# Patient Record
Sex: Female | Born: 1980 | Hispanic: Yes | Marital: Married | Smoking: Never smoker
Health system: Southern US, Community
[De-identification: ages and names within clinical notes are randomized; demographics above are authoritative.]

## PROBLEM LIST (undated history)

## (undated) DIAGNOSIS — I10 Essential (primary) hypertension: Secondary | ICD-10-CM

## (undated) HISTORY — PX: TUBAL LIGATION: SHX77

## (undated) HISTORY — PX: KNEE SURGERY: SHX244

## (undated) HISTORY — DX: Essential (primary) hypertension: I10

---

## 2001-02-14 ENCOUNTER — Encounter: Payer: Self-pay | Admitting: Emergency Medicine

## 2001-02-14 ENCOUNTER — Encounter (INDEPENDENT_AMBULATORY_CARE_PROVIDER_SITE_OTHER): Payer: Self-pay | Admitting: Specialist

## 2001-02-14 ENCOUNTER — Ambulatory Visit (HOSPITAL_COMMUNITY): Admission: AD | Admit: 2001-02-14 | Discharge: 2001-02-14 | Payer: Self-pay | Admitting: Obstetrics & Gynecology

## 2001-12-22 ENCOUNTER — Other Ambulatory Visit: Admission: RE | Admit: 2001-12-22 | Discharge: 2001-12-22 | Payer: Self-pay | Admitting: Gynecology

## 2002-07-19 ENCOUNTER — Inpatient Hospital Stay (HOSPITAL_COMMUNITY): Admission: AD | Admit: 2002-07-19 | Discharge: 2002-07-22 | Payer: Self-pay | Admitting: Gynecology

## 2002-09-07 ENCOUNTER — Other Ambulatory Visit: Admission: RE | Admit: 2002-09-07 | Discharge: 2002-09-07 | Payer: Self-pay | Admitting: Gynecology

## 2003-09-13 ENCOUNTER — Emergency Department (HOSPITAL_COMMUNITY): Admission: EM | Admit: 2003-09-13 | Discharge: 2003-09-13 | Payer: Self-pay | Admitting: Emergency Medicine

## 2005-01-29 ENCOUNTER — Emergency Department (HOSPITAL_COMMUNITY): Admission: EM | Admit: 2005-01-29 | Discharge: 2005-01-29 | Payer: Self-pay | Admitting: Family Medicine

## 2006-12-24 ENCOUNTER — Emergency Department (HOSPITAL_COMMUNITY): Admission: EM | Admit: 2006-12-24 | Discharge: 2006-12-24 | Payer: Self-pay | Admitting: Emergency Medicine

## 2007-01-02 ENCOUNTER — Emergency Department (HOSPITAL_COMMUNITY): Admission: EM | Admit: 2007-01-02 | Discharge: 2007-01-02 | Payer: Self-pay | Admitting: Emergency Medicine

## 2007-01-29 ENCOUNTER — Encounter: Admission: RE | Admit: 2007-01-29 | Discharge: 2007-01-29 | Payer: Self-pay | Admitting: *Deleted

## 2007-02-17 ENCOUNTER — Encounter: Admission: RE | Admit: 2007-02-17 | Discharge: 2007-03-11 | Payer: Self-pay | Admitting: *Deleted

## 2007-08-08 IMAGING — CR DG RIBS W/ CHEST 3+V*R*
3 series · 3 of 3 positions shown · non-contrast
Comparison: 01/02/07.

CLINICAL DATA: 26 year old with rib fractures. 
 PA CHEST WITH RIGHT RIBS THREE VIEWS:

[view not recorded (1 of 3)]
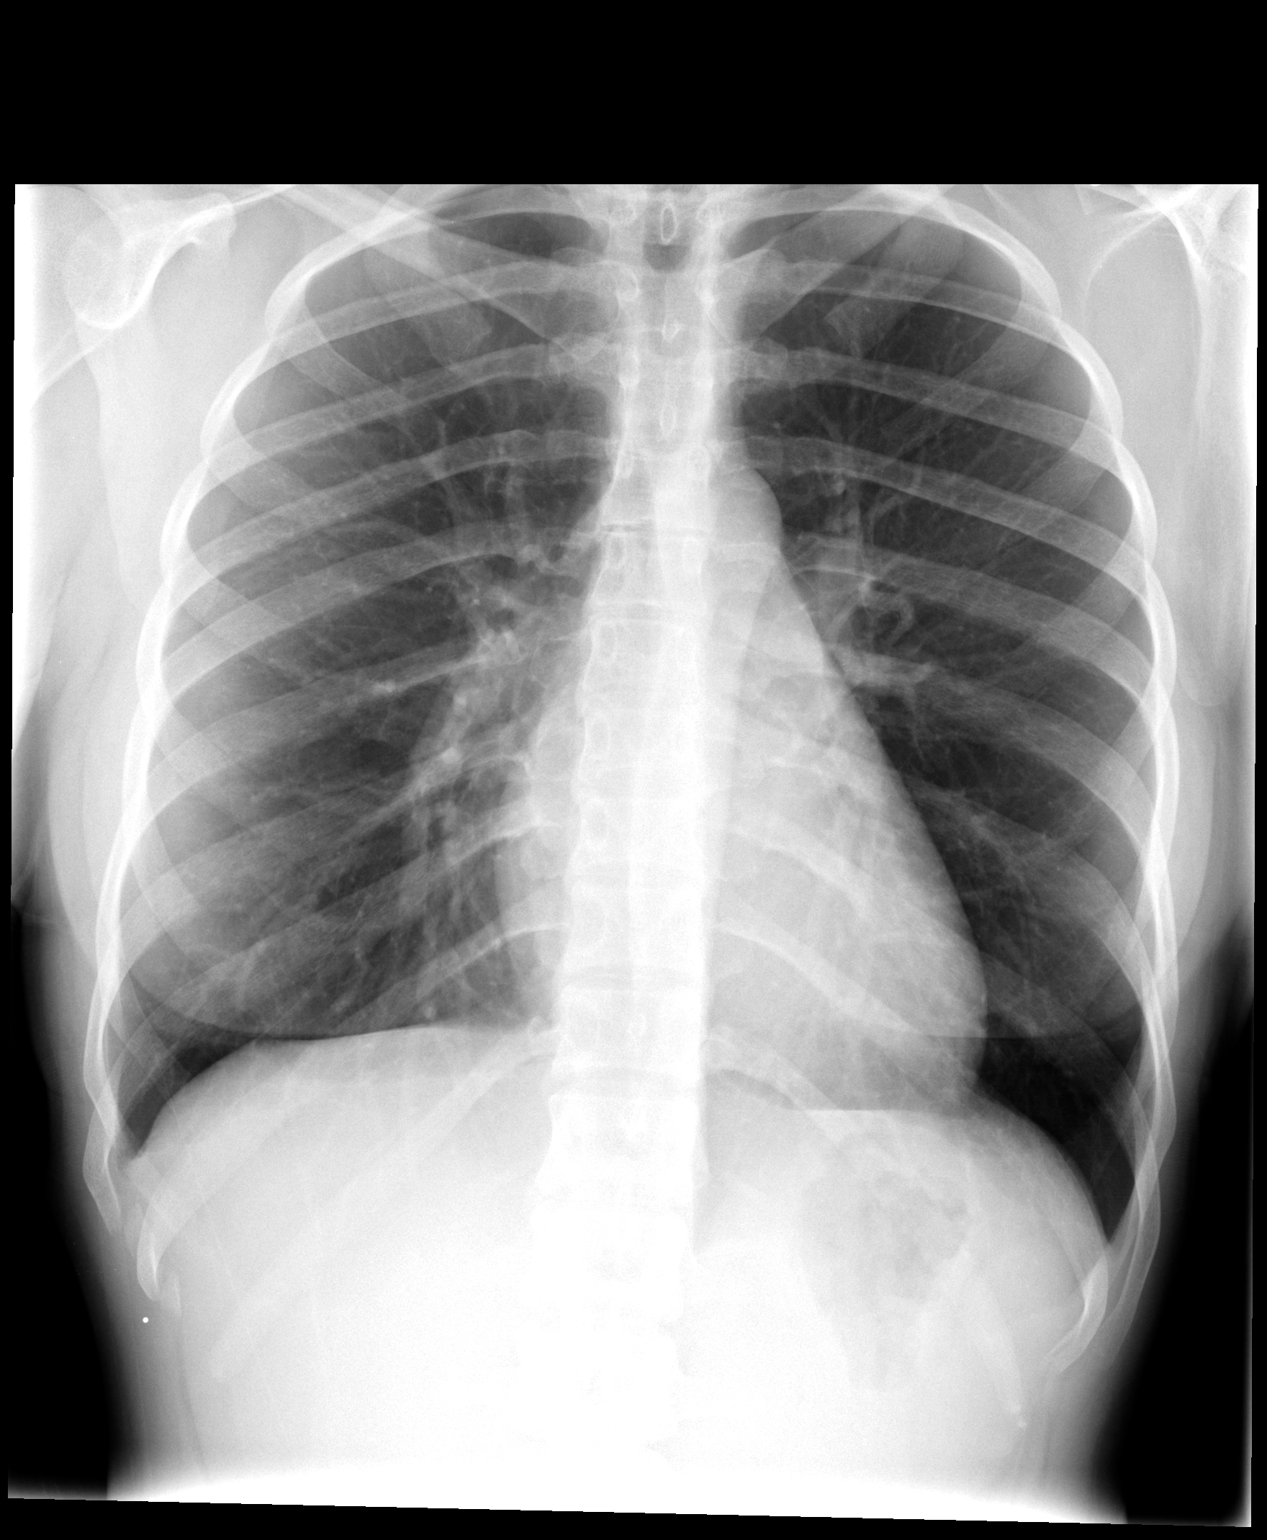

[view not recorded (2 of 3)]
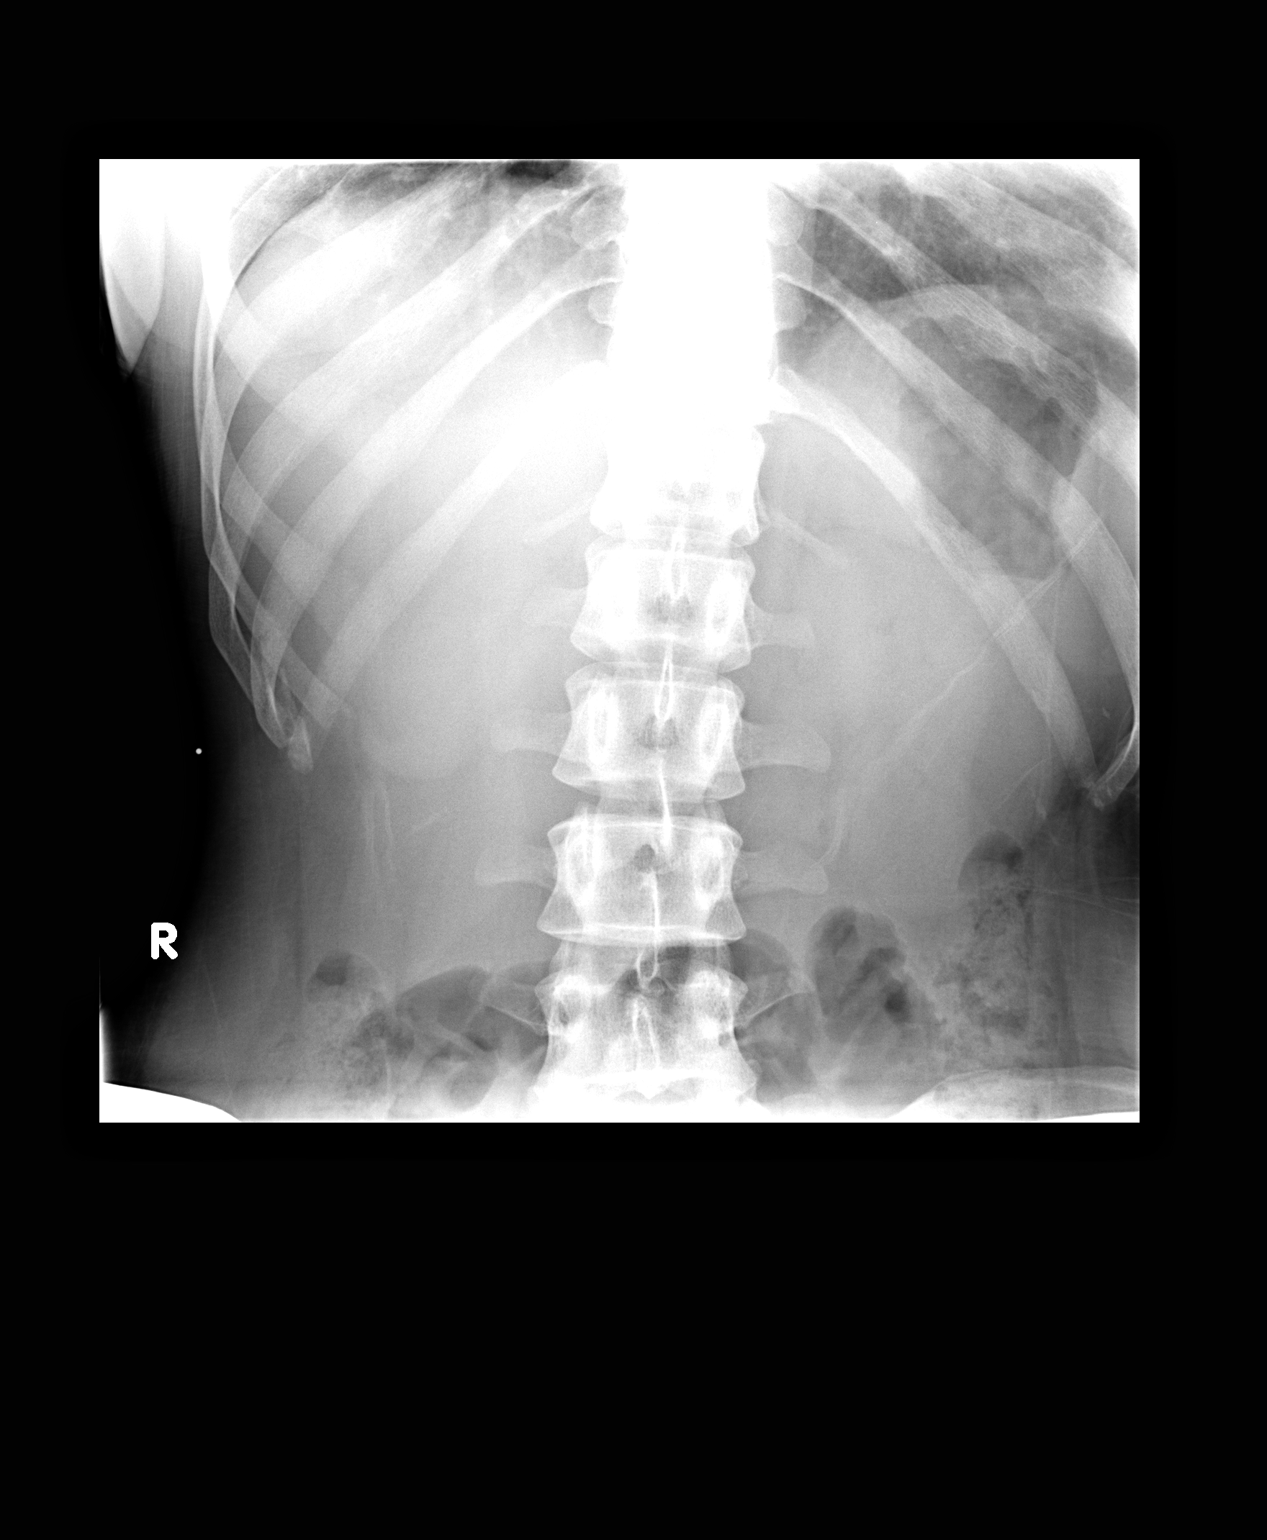

[view not recorded (3 of 3)]
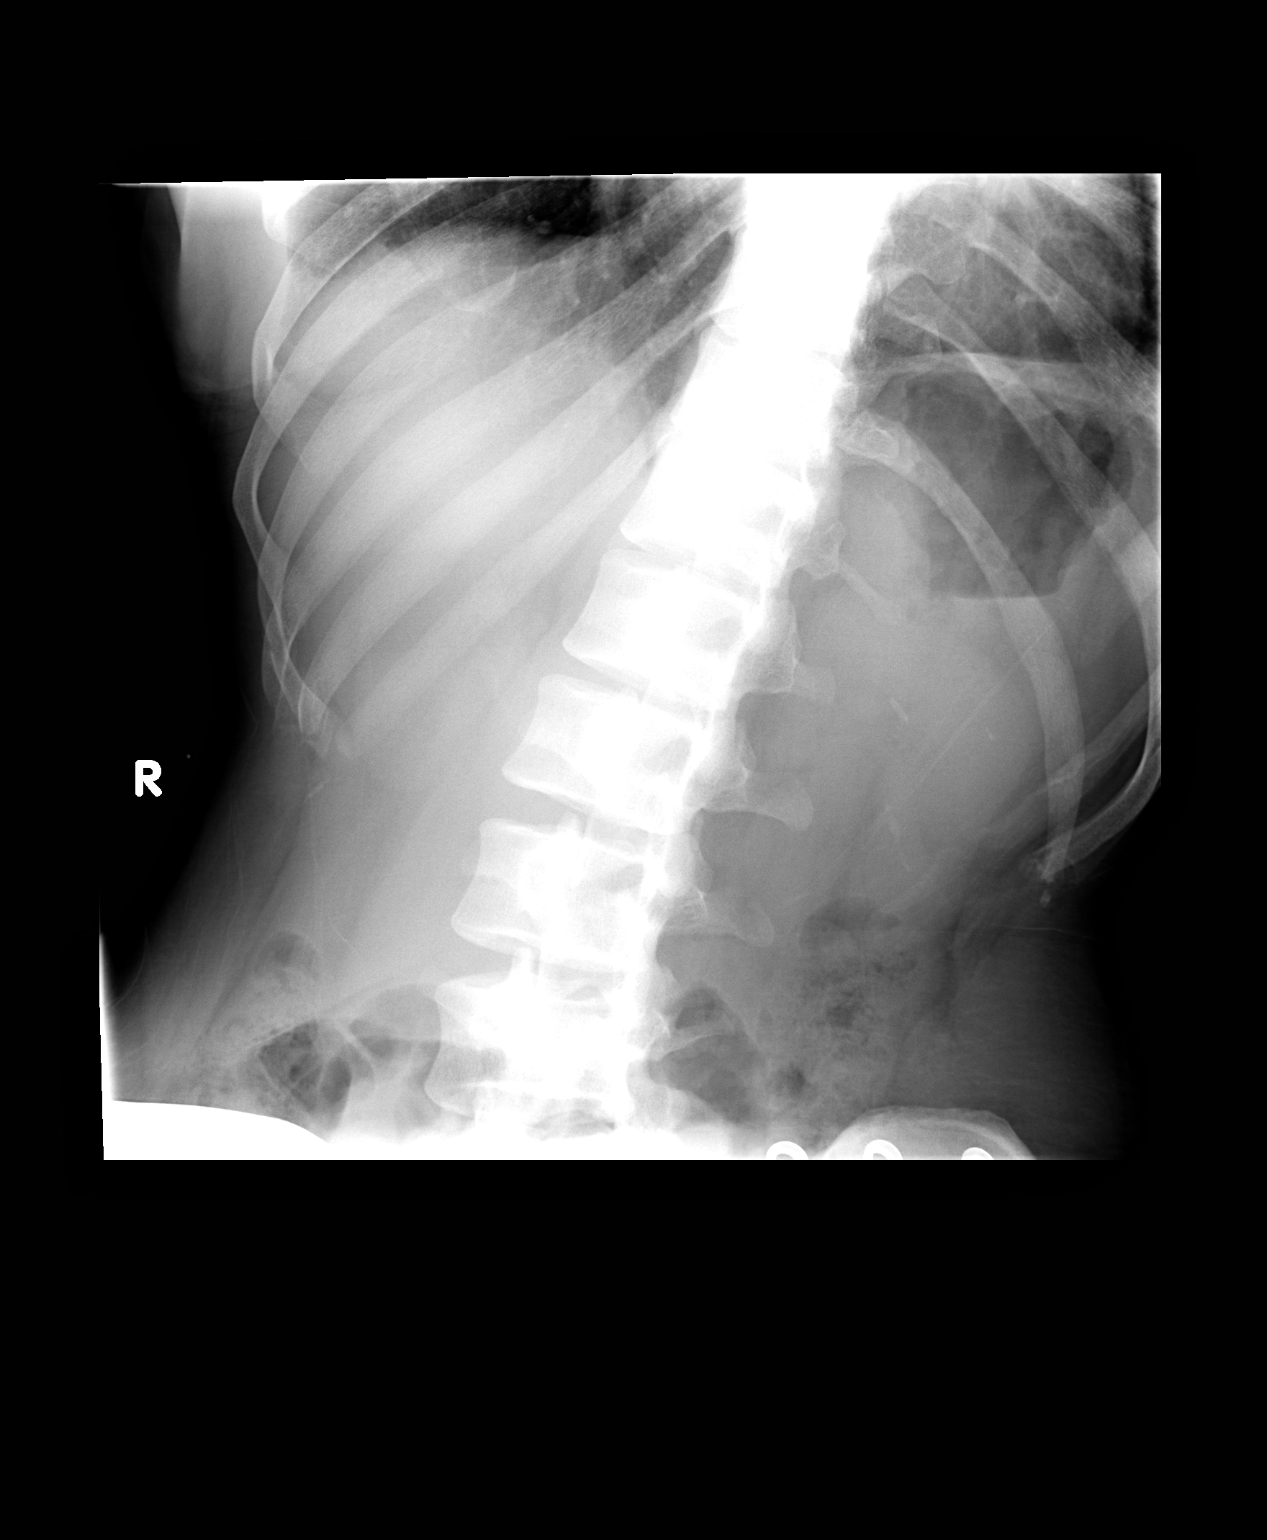

[3 of 3 positions shown; findings below may reference images not displayed]

FINDINGS: There are ununited fractures of the distal tips of the 10th and 11th ribs.  There is interval change with smooth margins.  No new fractures are seen.  The cardiac silhouette, mediastinal, and hilar contours are stable.  Lungs are clear.
IMPRESSION: 1.  Ununited 10th and 11th rib fracture on the right side.

## 2007-08-18 ENCOUNTER — Encounter: Admission: RE | Admit: 2007-08-18 | Discharge: 2007-08-18 | Payer: Self-pay | Admitting: Family Medicine

## 2009-01-13 ENCOUNTER — Emergency Department (HOSPITAL_COMMUNITY): Admission: EM | Admit: 2009-01-13 | Discharge: 2009-01-13 | Payer: Self-pay | Admitting: Family Medicine

## 2009-05-28 ENCOUNTER — Emergency Department (HOSPITAL_COMMUNITY): Admission: EM | Admit: 2009-05-28 | Discharge: 2009-05-28 | Payer: Self-pay | Admitting: Family Medicine

## 2009-05-29 ENCOUNTER — Inpatient Hospital Stay (HOSPITAL_COMMUNITY): Admission: AD | Admit: 2009-05-29 | Discharge: 2009-05-29 | Payer: Self-pay | Admitting: Gynecology

## 2009-06-01 ENCOUNTER — Inpatient Hospital Stay (HOSPITAL_COMMUNITY): Admission: AD | Admit: 2009-06-01 | Discharge: 2009-06-01 | Payer: Self-pay | Admitting: Gynecology

## 2009-06-08 ENCOUNTER — Ambulatory Visit (HOSPITAL_COMMUNITY): Admission: RE | Admit: 2009-06-08 | Discharge: 2009-06-08 | Payer: Self-pay | Admitting: Family Medicine

## 2009-06-26 ENCOUNTER — Encounter (INDEPENDENT_AMBULATORY_CARE_PROVIDER_SITE_OTHER): Payer: Self-pay | Admitting: Obstetrics and Gynecology

## 2009-06-26 ENCOUNTER — Ambulatory Visit (HOSPITAL_COMMUNITY): Admission: RE | Admit: 2009-06-26 | Discharge: 2009-06-26 | Payer: Self-pay | Admitting: Obstetrics and Gynecology

## 2010-05-31 ENCOUNTER — Inpatient Hospital Stay (HOSPITAL_COMMUNITY): Admission: RE | Admit: 2010-05-31 | Discharge: 2010-06-02 | Payer: Self-pay | Admitting: Obstetrics and Gynecology

## 2010-05-31 ENCOUNTER — Encounter (INDEPENDENT_AMBULATORY_CARE_PROVIDER_SITE_OTHER): Payer: Self-pay | Admitting: Obstetrics and Gynecology

## 2010-06-18 ENCOUNTER — Emergency Department (HOSPITAL_COMMUNITY): Admission: EM | Admit: 2010-06-18 | Discharge: 2010-06-18 | Payer: Self-pay | Admitting: Emergency Medicine

## 2010-11-22 LAB — CBC
HCT: 33.5 % — ABNORMAL LOW (ref 36.0–46.0)
HCT: 33.8 % — ABNORMAL LOW (ref 36.0–46.0)
Hemoglobin: 11.3 g/dL — ABNORMAL LOW (ref 12.0–15.0)
Hemoglobin: 11.5 g/dL — ABNORMAL LOW (ref 12.0–15.0)
MCH: 30.7 pg (ref 26.0–34.0)
RBC: 3.73 MIL/uL — ABNORMAL LOW (ref 3.87–5.11)
WBC: 11.9 10*3/uL — ABNORMAL HIGH (ref 4.0–10.5)

## 2010-11-22 LAB — RPR: RPR Ser Ql: NONREACTIVE

## 2010-11-22 LAB — SURGICAL PCR SCREEN
MRSA, PCR: NEGATIVE
Staphylococcus aureus: NEGATIVE

## 2010-12-13 LAB — APTT: aPTT: 36 seconds (ref 24–37)

## 2010-12-13 LAB — TYPE AND SCREEN: Antibody Screen: NEGATIVE

## 2010-12-13 LAB — CBC
Hemoglobin: 12.4 g/dL (ref 12.0–15.0)
RBC: 4.11 MIL/uL (ref 3.87–5.11)

## 2010-12-13 LAB — PROTIME-INR: INR: 1.09 (ref 0.00–1.49)

## 2010-12-14 LAB — HCG, QUANTITATIVE, PREGNANCY: hCG, Beta Chain, Quant, S: 262 m[IU]/mL — ABNORMAL HIGH (ref <5)

## 2010-12-14 LAB — POCT URINALYSIS DIP (DEVICE)
Bilirubin Urine: NEGATIVE
Glucose, UA: NEGATIVE mg/dL
Ketones, ur: NEGATIVE mg/dL
Protein, ur: NEGATIVE mg/dL

## 2010-12-14 LAB — GC/CHLAMYDIA PROBE AMP, GENITAL: Chlamydia, DNA Probe: NEGATIVE

## 2010-12-14 LAB — WET PREP, GENITAL: Yeast Wet Prep HPF POC: NONE SEEN

## 2010-12-14 LAB — POCT PREGNANCY, URINE: Preg Test, Ur: POSITIVE

## 2011-01-25 NOTE — Op Note (Signed)
[facility]  Patient:    Courtney [name], Courtney [name]                     MRN: [id number] Proc. Date: 02/14/01 Adm. Date:  [id number] Attending:  Lars Pinks                           Operative Report  PREOPERATIVE DIAGNOSIS:       Missed abortion.  POSTOPERATIVE DIAGNOSIS:      Missed abortion.  PROCEDURE:                    Dilatation and evacuation.  SURGEON:                      Richard D. Arlyce Dice, M.D.  ANESTHESIA:                   Paracervical block with IV sedation.  ESTIMATED BLOOD LOSS:         10 cc.  FINDINGS:                     Products of conception consistent with early missed abortion.  INDICATIONS:                  This is a 30 year old primigravida who presented to [facility] with vaginal bleeding and cramping and was found to be pregnant with a quantitative hCG of over 4000. An ultrasound was performed which showed a gestational sac diameter consistent with 6-week pregnancy but no signs of embryonic development or yolk sac. Based upon this ultrasound report and the presenting symptoms of the patient, the diagnosis of missed abortion was made. The options were discussed with the patient who elected to proceed with dilatation and evacuation. The patient was therefore transferred to [facility] for the operation. On arrival to [facility], informed consent was given. She was then taken to the operating room for the procedure.  DESCRIPTION OF PROCEDURE:     The patient was taken to the operating room, placed in supine position, and IV sedation was administered. She was then placed in the dorsal lithotomy position and the vagina and vulva were prepped and draped in sterile fashion. The cervix was visualized and grasped with a single-tooth tenaculum, and 20 cc of 1% lidocaine was infiltrated in the paracervical tissues. A #7 suction curet was then introduced into the endometrial cavity without the need of  dilatation. Suction curettage was then carried out until the endometrial cavity was empty. The procedure was then terminated and the patient left the operating room in good condition. DD:  02/14/01 TD:  02/15/01 Job: [id number]

## 2011-01-25 NOTE — Discharge Summary (Signed)
   NAME:  Courtney [name], Courtney [name]                        ACCOUNT NO.:  [id number]   MEDICAL RECORD NO.:  [id number]                   PATIENT TYPE:  INP   LOCATION:  9141                                 FACILITY:  [facility]   PHYSICIAN:  Timothy P. Fontaine, M.D.           DATE OF BIRTH:  [birth year]   DATE OF ADMISSION:  07/19/2002  DATE OF DISCHARGE:  07/22/2002                                 DISCHARGE SUMMARY   DISCHARGE DIAGNOSES:  1. Intrauterine pregnancy 39 weeks, delivered.  2. Footling breech presentation.  3. Status post primary low transverse cesarean section by Dr. Colin Broach July 19, 2002.   HISTORY:  This is [name] 30 year old female gravida 2, para 0, aborta 1 with an  EDC of July 25, 2002 whose prenatal course was complicated by having  persistent footling breech and patient did not desire attempt at version and  opted for primary cesarean section.  Therefore, was admitted for same.   HOSPITAL COURSE:  On July 19, 2002 patient was admitted and underwent [name]  primary low transverse cesarean section by Dr. Colin Broach without  complications with delivery of [name] female, Apgars of 9 and 9, weight 7 pounds  5 ounces.  There were no complications.  Postpartum patient remained  afebrile, voiding, stable condition and she was discharged to home on  July 22, 2002 and given [facility] Gynecology postpartum  instruction/postpartum booklet.   ACCESSORY CLINICAL FINDINGS:  Laboratories:  The patient is O+.  Rubella  immune.  On July 20, 2002 hemoglobin was 11.   DISPOSITION:  The patient is discharged home.  Informed to return to office  in six weeks.  Any problem prior to that time to see in the office.  Given [name]  prescription for Tylox p.r.n. pain.     Susa Loffler, P.[name].                    Timothy P. Audie Box, M.D.    TSG/MEDQ  D:  08/20/2002  T:  08/20/2002  Job:  [id number]

## 2011-01-25 NOTE — H&P (Signed)
   NAME:  Courtney Courtney [name], Courtney Courtney [name]                        ACCOUNT NO.:  [id number]   MEDICAL RECORD NO.:  [id number]                   PATIENT TYPE:  INP   LOCATION:  NA                                   FACILITY:  [facility]   PHYSICIAN:  Timothy P. Fontaine, M.D.           DATE OF BIRTH:  [birth year]   DATE OF ADMISSION:  07/19/2002  DATE OF DISCHARGE:                                HISTORY & PHYSICAL   CHIEF COMPLAINT:  1. Pregnancy at term.  2. Breech presentation.   HISTORY OF PRESENT ILLNESS:  Courtney [name] 30 year old G2, P0, AB1 female at term  gestation with Courtney [name] persistent breech presentation.  Options for version versus  primary section was discussed with the patient and her husband and they  desire primary section.  Prenatal course has been uncomplicated to date.  See the remainder of the [place] for the rest of her history.   PHYSICAL EXAMINATION:  HEENT:  Normal.  LUNGS:  Clear.  CARDIAC:  Regular rate.  No murmur, rub, or gallop.  ABDOMEN:  Breech consistent with term.  Positive fetal heart tones.  PELVIC:  Deferred.   ASSESSMENT:  Courtney [name] 30 year old G2, P0, AB1 female term gestation, breech  presentation for primary cesarean section.  Risks, benefits, indications,  and alternatives for the procedure were reviewed with the patient and her  husband to include infection, prolonged antibiotics, abscess formation,  drainage, wound complications requiring opening and draining of wounds,  closure be secondary intention, hemorrhage necessitating transfusion, and  the risk of transfusion, inadvertent injury to internal organs including  bowel, bladder, ureters, vessels, and nerves necessitating major exploratory  reparative surgeries and future reparative surgeries including ostomy  formation.  The risks of fetal injury during the birthing process was also  discussed, understood, and accepted.  The patient's questions are answered  and she is ready to proceed with surgery.                     Timothy P. Audie Box, M.D.    TPF/MEDQ  D:  07/14/2002  T:  07/14/2002  Job:  [id number]

## 2011-01-25 NOTE — Op Note (Signed)
NAME:  Courtney [name], Courtney [name]                        ACCOUNT NO.:  [id number]   MEDICAL RECORD NO.:  [id number]                   PATIENT TYPE:  INP   LOCATION:  9198                                 FACILITY:  [facility]   PHYSICIAN:  Timothy P. Fontaine, M.D.           DATE OF BIRTH:  12-May-1981   DATE OF PROCEDURE:  07/19/2002  DATE OF DISCHARGE:                                 OPERATIVE REPORT   PREOPERATIVE DIAGNOSES:  1. Pregnancy at term.  2. Footling breech presentation.   POSTOPERATIVE DIAGNOSES:  1. Pregnancy at term.  2. Footling breech presentation.   PROCEDURE:  Primary low transverse cervical cesarean section.   SURGEON:  Timothy P. Fontaine, M.D.   ASSISTANTGaetano Hawthorne. Lily Peer, M.D.   ANESTHESIA:  Spinal.   ESTIMATED BLOOD LOSS:  Less than 500 cc.   COMPLICATIONS:  None.   SPECIMENS:  Samples of cord blood.   FINDINGS:  At 9 normal female in the footling breech presentation.  Apgars 9 and 9.  Weight pending.  Pelvic anatomy noted to be normal.   PROCEDURE:  The patient is taken to the operating room.  Underwent regional  anesthesia.  Was placed in left tilt supine position.  Received an abdominal  preparation with Betadine scrub and Betadine solution.  Foley catheter  placed in [name] sterile technique and the patient was draped in the usual  fashion.  After assuring adequate anesthesia the abdomen was sharply entered  through [name] Pfannenstiel incision achieving adequate hemostasis at all levels.  The bladder flap was sharply and bluntly developed without difficulty and  the uterus was sharply entered in the lower uterine segment and bluntly  extended laterally.  The membranes were ruptured, the fluid noted to be  clear.  The infant found in the footling breech presentation and underwent [name]  classic breech extraction without difficulty.  The nares and mouth were  suctioned, the cord doubly clamped and cut, and the infant was handed to  pediatrics in attendance.  Samples  of cord blood were obtained.  Placenta  was then spontaneously extruded, noted to be intact.  The uterus was  exteriorized.  The endometrial cavity explored with [name] sponge to remove all  placental membrane fragments.  The patient received 1 g Ancef of IV  prophylaxis at this time.  The uterine incision was closed in one layer  using 0 Vicryl suture in [name] running interlocking stitch.  Adequate hemostasis  was visualized after copious irrigation and the uterus was returned to the  abdomen with copious irrigation.  The anterior fascia was then  reapproximated using 0 Vicryl suture in [name] running stitch starting at the  angle, meeting in the midline.  Subcutaneous tissue was irrigated.  Hemostasis achieved with electrocautery.  Skin reapproximated with staples.  Sterile dressing applied.  The patient was taken to recovery room in good  condition having tolerated the procedure.  Timothy P. Audie Box, M.D.    TPF/MEDQ  D:  07/19/2002  T:  07/19/2002  Job:  [id number]

## 2011-02-26 ENCOUNTER — Inpatient Hospital Stay (INDEPENDENT_AMBULATORY_CARE_PROVIDER_SITE_OTHER)
Admission: RE | Admit: 2011-02-26 | Discharge: 2011-02-26 | Disposition: A | Discharge status: Home / Self Care | Payer: BC Managed Care – PPO | Source: Ambulatory Visit | Attending: Emergency Medicine | Admitting: Emergency Medicine

## 2011-02-26 ENCOUNTER — Ambulatory Visit (INDEPENDENT_AMBULATORY_CARE_PROVIDER_SITE_OTHER): Payer: BC Managed Care – PPO

## 2011-02-26 DIAGNOSIS — S61209A Unspecified open wound of unspecified finger without damage to nail, initial encounter: Secondary | ICD-10-CM

## 2011-02-28 ENCOUNTER — Inpatient Hospital Stay (INDEPENDENT_AMBULATORY_CARE_PROVIDER_SITE_OTHER)
Admission: RE | Admit: 2011-02-28 | Discharge: 2011-02-28 | Disposition: A | Discharge status: Home / Self Care | Payer: BC Managed Care – PPO | Source: Ambulatory Visit | Attending: Family Medicine | Admitting: Family Medicine

## 2011-02-28 DIAGNOSIS — S61209A Unspecified open wound of unspecified finger without damage to nail, initial encounter: Secondary | ICD-10-CM

## 2012-06-09 LAB — HM PAP SMEAR

## 2013-04-16 ENCOUNTER — Encounter: Payer: Self-pay | Admitting: Internal Medicine

## 2013-04-16 ENCOUNTER — Other Ambulatory Visit (INDEPENDENT_AMBULATORY_CARE_PROVIDER_SITE_OTHER): Payer: BC Managed Care – PPO

## 2013-04-16 ENCOUNTER — Ambulatory Visit (INDEPENDENT_AMBULATORY_CARE_PROVIDER_SITE_OTHER): Payer: BC Managed Care – PPO | Admitting: Internal Medicine

## 2013-04-16 VITALS — BP 116/78 | HR 68 | Temp 98.6°F | Resp 16 | Ht 66.0 in | Wt 176.0 lb

## 2013-04-16 DIAGNOSIS — D649 Anemia, unspecified: Secondary | ICD-10-CM

## 2013-04-16 DIAGNOSIS — Z23 Encounter for immunization: Secondary | ICD-10-CM

## 2013-04-16 DIAGNOSIS — Z0000 Encounter for general adult medical examination without abnormal findings: Secondary | ICD-10-CM

## 2013-04-16 LAB — COMPREHENSIVE METABOLIC PANEL
ALT: 19 U/L (ref 0–35)
Alkaline Phosphatase: 30 U/L — ABNORMAL LOW (ref 39–117)
Creatinine, Ser: 0.9 mg/dL (ref 0.4–1.2)
GFR: 81.21 mL/min (ref >60.00)
Sodium: 140 mEq/L (ref 135–145)
Total Bilirubin: 0.4 mg/dL (ref 0.3–1.2)
Total Protein: 6.9 g/dL (ref 6.0–8.3)

## 2013-04-16 LAB — LIPID PANEL
Cholesterol: 180 mg/dL (ref 0–200)
HDL: 98.2 mg/dL (ref >39.00)
Total CHOL/HDL Ratio: 2
Triglycerides: 71 mg/dL (ref 0.0–149.0)

## 2013-04-16 LAB — CBC WITH DIFFERENTIAL/PLATELET
Basophils Absolute: 0 10*3/uL (ref 0.0–0.1)
Eosinophils Absolute: 0.2 10*3/uL (ref 0.0–0.7)
HCT: 37.3 % (ref 36.0–46.0)
Hemoglobin: 12.4 g/dL (ref 12.0–15.0)
Lymphs Abs: 2.5 10*3/uL (ref 0.7–4.0)
MCHC: 33.1 g/dL (ref 30.0–36.0)
MCV: 89.2 fl (ref 78.0–100.0)
Monocytes Absolute: 0.5 10*3/uL (ref 0.1–1.0)
Neutro Abs: 5.8 10*3/uL (ref 1.4–7.7)
Platelets: 208 10*3/uL (ref 150.0–400.0)
RDW: 13.2 % (ref 11.5–14.6)

## 2013-04-16 LAB — TSH: TSH: 0.78 u[IU]/mL (ref 0.35–5.50)

## 2013-04-16 LAB — FOLATE: Folate: 23.2 ng/mL (ref >5.9)

## 2013-04-16 LAB — IBC PANEL
Iron: 78 ug/dL (ref 42–145)
Transferrin: 315.1 mg/dL (ref 212.0–360.0)

## 2013-04-16 NOTE — Assessment & Plan Note (Signed)
I think she has iron deficiency from heavy periods, I will recheck her CBC and will look at her vitamin levels as well

## 2013-04-16 NOTE — Progress Notes (Signed)
  Subjective:    Patient ID: Courtney [name], female    DOB: 1980/10/24, 32 y.o.   MRN: [id number]  Anemia Presents for follow-up visit. There has been no abdominal pain, anorexia, bruising/bleeding easily, confusion, fever, leg swelling, light-headedness, malaise/fatigue, pallor, palpitations, paresthesias, pica or weight loss. Signs of blood loss that are present include menorrhagia. Signs of blood loss that are not present include hematemesis, hematochezia, melena and vaginal bleeding. There are no compliance problems.       Review of Systems  Constitutional: Negative.  Negative for fever, chills, weight loss, malaise/fatigue, diaphoresis, appetite change and fatigue.  HENT: Negative.   Eyes: Negative.   Respiratory: Negative.  Negative for cough, chest tightness, shortness of breath, wheezing and stridor.   Cardiovascular: Negative.  Negative for chest pain, palpitations and leg swelling.  Gastrointestinal: Negative.  Negative for nausea, abdominal pain, diarrhea, constipation, melena, hematochezia, anal bleeding, anorexia and hematemesis.  Endocrine: Negative.   Genitourinary: Positive for menorrhagia. Negative for hematuria, flank pain, vaginal bleeding, vaginal discharge and vaginal pain.  Musculoskeletal: Negative.   Skin: Negative.  Negative for pallor.  Allergic/Immunologic: Negative.   Neurological: Negative.  Negative for dizziness, tremors, weakness, light-headedness, numbness, headaches and paresthesias.  Hematological: Negative.  Negative for adenopathy. Does not bruise/bleed easily.  Psychiatric/Behavioral: Negative.  Negative for confusion.       Objective:   Physical Exam  Vitals reviewed. Constitutional: She is oriented to person, place, and time. She appears well-developed and well-nourished. No distress.  HENT:  Head: Normocephalic and atraumatic.  Mouth/Throat: Oropharynx is clear and moist. No oropharyngeal exudate.  Eyes: Conjunctivae are normal. Right eye  exhibits no discharge. Left eye exhibits no discharge. No scleral icterus.  Neck: Normal range of motion. Neck supple. No JVD present. No tracheal deviation present. No thyromegaly present.  Cardiovascular: Normal rate, regular rhythm, normal heart sounds and intact distal pulses.  Exam reveals no gallop and no friction rub.   No murmur heard. Pulmonary/Chest: Effort normal and breath sounds normal. No stridor. No respiratory distress. She has no wheezes. She has no rales. She exhibits no tenderness.  Abdominal: Soft. Bowel sounds are normal. She exhibits no distension and no mass. There is no tenderness. There is no rebound and no guarding.  Musculoskeletal: Normal range of motion. She exhibits no edema and no tenderness.  Lymphadenopathy:    She has no cervical adenopathy.  Neurological: She is oriented to person, place, and time.  Skin: Skin is warm and dry. No rash noted. She is not diaphoretic. No erythema. No pallor.  Psychiatric: She has a normal mood and affect. Her behavior is normal. Judgment and thought content normal.     Lab Results  Component Value Date   WBC 11.9* 06/01/2010   HGB 11.3* 06/01/2010   HCT 33.5* 06/01/2010   PLT 197 06/01/2010   INR 1.09 06/26/2009       Assessment & Plan:

## 2013-04-16 NOTE — Patient Instructions (Signed)
Preventive Care for Adults, Female A healthy lifestyle and preventive care can promote health and wellness. Preventive health guidelines for women include the following key practices.  A routine yearly physical is a good way to check with your caregiver about your health and preventive screening. It is a chance to share any concerns and updates on your health, and to receive a thorough exam.  Visit your dentist for a routine exam and preventive care every 6 months. Brush your teeth twice a day and floss once a day. Good oral hygiene prevents tooth decay and gum disease.  The frequency of eye exams is based on your age, health, family medical history, use of contact lenses, and other factors. Follow your caregiver's recommendations for frequency of eye exams.  Eat a healthy diet. Foods like vegetables, fruits, whole grains, low-fat dairy products, and lean protein foods contain the nutrients you need without too many calories. Decrease your intake of foods high in solid fats, added sugars, and salt. Eat the right amount of calories for you.Get information about a proper diet from your caregiver, if necessary.  Regular physical exercise is one of the most important things you can do for your health. Most adults should get at least 150 minutes of moderate-intensity exercise (any activity that increases your heart rate and causes you to sweat) each week. In addition, most adults need muscle-strengthening exercises on 2 or more days a week.  Maintain a healthy weight. The body mass index (BMI) is a screening tool to identify possible weight problems. It provides an estimate of body fat based on height and weight. Your caregiver can help determine your BMI, and can help you achieve or maintain a healthy weight.For adults 20 years and older:  A BMI below 18.5 is considered underweight.  A BMI of 18.5 to 24.9 is normal.  A BMI of 25 to 29.9 is considered overweight.  A BMI of 30 and above is  considered obese.  Maintain normal blood lipids and cholesterol levels by exercising and minimizing your intake of saturated fat. Eat a balanced diet with plenty of fruit and vegetables. Blood tests for lipids and cholesterol should begin at age 20 and be repeated every 5 years. If your lipid or cholesterol levels are high, you are over 50, or you are at high risk for heart disease, you may need your cholesterol levels checked more frequently.Ongoing high lipid and cholesterol levels should be treated with medicines if diet and exercise are not effective.  If you smoke, find out from your caregiver how to quit. If you do not use tobacco, do not start.  If you are pregnant, do not drink alcohol. If you are breastfeeding, be very cautious about drinking alcohol. If you are not pregnant and choose to drink alcohol, do not exceed 1 drink per day. One drink is considered to be 12 ounces (355 mL) of beer, 5 ounces (148 mL) of wine, or 1.5 ounces (44 mL) of liquor.  Avoid use of street drugs. Do not share needles with anyone. Ask for help if you need support or instructions about stopping the use of drugs.  High blood pressure causes heart disease and increases the risk of stroke. Your blood pressure should be checked at least every 1 to 2 years. Ongoing high blood pressure should be treated with medicines if weight loss and exercise are not effective.  If you are 55 to 34 years old, ask your caregiver if you should take aspirin to prevent strokes.  Diabetes   screening involves taking a blood sample to check your fasting blood sugar level. This should be done once every 3 years, after age 45, if you are within normal weight and without risk factors for diabetes. Testing should be considered at a younger age or be carried out more frequently if you are overweight and have at least 1 risk factor for diabetes.  Breast cancer screening is essential preventive care for women. You should practice "breast  self-awareness." This means understanding the normal appearance and feel of your breasts and may include breast self-examination. Any changes detected, no matter how small, should be reported to a caregiver. Women in their 20s and 30s should have a clinical breast exam (CBE) by a caregiver as part of a regular health exam every 1 to 3 years. After age 40, women should have a CBE every year. Starting at age 40, women should consider having a mammography (breast X-ray test) every year. Women who have a family history of breast cancer should talk to their caregiver about genetic screening. Women at a high risk of breast cancer should talk to their caregivers about having magnetic resonance imaging (MRI) and a mammography every year.  The Pap test is a screening test for cervical cancer. A Pap test can show cell changes on the cervix that might become cervical cancer if left untreated. A Pap test is a procedure in which cells are obtained and examined from the lower end of the uterus (cervix).  Women should have a Pap test starting at age 21.  Between ages 21 and 29, Pap tests should be repeated every 2 years.  Beginning at age 30, you should have a Pap test every 3 years as long as the past 3 Pap tests have been normal.  Some women have medical problems that increase the chance of getting cervical cancer. Talk to your caregiver about these problems. It is especially important to talk to your caregiver if a new problem develops soon after your last Pap test. In these cases, your caregiver may recommend more frequent screening and Pap tests.  The above recommendations are the same for women who have or have not gotten the vaccine for human papillomavirus (HPV).  If you had a hysterectomy for a problem that was not cancer or a condition that could lead to cancer, then you no longer need Pap tests. Even if you no longer need a Pap test, a regular exam is a good idea to make sure no other problems are  starting.  If you are between ages 65 and 70, and you have had normal Pap tests going back 10 years, you no longer need Pap tests. Even if you no longer need a Pap test, a regular exam is a good idea to make sure no other problems are starting.  If you have had past treatment for cervical cancer or a condition that could lead to cancer, you need Pap tests and screening for cancer for at least 20 years after your treatment.  If Pap tests have been discontinued, risk factors (such as a new sexual partner) need to be reassessed to determine if screening should be resumed.  The HPV test is an additional test that may be used for cervical cancer screening. The HPV test looks for the virus that can cause the cell changes on the cervix. The cells collected during the Pap test can be tested for HPV. The HPV test could be used to screen women aged 30 years and older, and should   be used in women of any age who have unclear Pap test results. After the age of 30, women should have HPV testing at the same frequency as a Pap test.  Colorectal cancer can be detected and often prevented. Most routine colorectal cancer screening begins at the age of 50 and continues through age 75. However, your caregiver may recommend screening at an earlier age if you have risk factors for colon cancer. On a yearly basis, your caregiver may provide home test kits to check for hidden blood in the stool. Use of a small camera at the end of a tube, to directly examine the colon (sigmoidoscopy or colonoscopy), can detect the earliest forms of colorectal cancer. Talk to your caregiver about this at age 50, when routine screening begins. Direct examination of the colon should be repeated every 5 to 10 years through age 75, unless early forms of pre-cancerous polyps or small growths are found.  Hepatitis C blood testing is recommended for all people born from 1945 through 1965 and any individual with known risks for hepatitis C.  Practice  safe sex. Use condoms and avoid high-risk sexual practices to reduce the spread of sexually transmitted infections (STIs). STIs include gonorrhea, chlamydia, syphilis, trichomonas, herpes, HPV, and human immunodeficiency virus (HIV). Herpes, HIV, and HPV are viral illnesses that have no cure. They can result in disability, cancer, and death. Sexually active women aged 25 and younger should be checked for chlamydia. Older women with new or multiple partners should also be tested for chlamydia. Testing for other STIs is recommended if you are sexually active and at increased risk.  Osteoporosis is a disease in which the bones lose minerals and strength with aging. This can result in serious bone fractures. The risk of osteoporosis can be identified using a bone density scan. Women ages 65 and over and women at risk for fractures or osteoporosis should discuss screening with their caregivers. Ask your caregiver whether you should take a calcium supplement or vitamin D to reduce the rate of osteoporosis.  Menopause can be associated with physical symptoms and risks. Hormone replacement therapy is available to decrease symptoms and risks. You should talk to your caregiver about whether hormone replacement therapy is right for you.  Use sunscreen with sun protection factor (SPF) of 30 or more. Apply sunscreen liberally and repeatedly throughout the day. You should seek shade when your shadow is shorter than you. Protect yourself by wearing long sleeves, pants, a wide-brimmed hat, and sunglasses year round, whenever you are outdoors.  Once a month, do a whole body skin exam, using a mirror to look at the skin on your back. Notify your caregiver of new moles, moles that have irregular borders, moles that are larger than a pencil eraser, or moles that have changed in shape or color.  Stay current with required immunizations.  Influenza. You need a dose every fall (or winter). The composition of the flu vaccine  changes each year, so being vaccinated once is not enough.  Pneumococcal polysaccharide. You need 1 to 2 doses if you smoke cigarettes or if you have certain chronic medical conditions. You need 1 dose at age 65 (or older) if you have never been vaccinated.  Tetanus, diphtheria, pertussis (Tdap, Td). Get 1 dose of Tdap vaccine if you are younger than age 65, are over 65 and have contact with an infant, are a healthcare worker, are pregnant, or simply want to be protected from whooping cough. After that, you need a Td   booster dose every 10 years. Consult your caregiver if you have not had at least 3 tetanus and diphtheria-containing shots sometime in your life or have a deep or dirty wound.  HPV. You need this vaccine if you are a woman age 26 or younger. The vaccine is given in 3 doses over 6 months.  Measles, mumps, rubella (MMR). You need at least 1 dose of MMR if you were born in 1957 or later. You may also need a second dose.  Meningococcal. If you are age 19 to 21 and a first-year college student living in a residence hall, or have one of several medical conditions, you need to get vaccinated against meningococcal disease. You may also need additional booster doses.  Zoster (shingles). If you are age 60 or older, you should get this vaccine.  Varicella (chickenpox). If you have never had chickenpox or you were vaccinated but received only 1 dose, talk to your caregiver to find out if you need this vaccine.  Hepatitis A. You need this vaccine if you have a specific risk factor for hepatitis A virus infection or you simply wish to be protected from this disease. The vaccine is usually given as 2 doses, 6 to 18 months apart.  Hepatitis B. You need this vaccine if you have a specific risk factor for hepatitis B virus infection or you simply wish to be protected from this disease. The vaccine is given in 3 doses, usually over 6 months. Preventive Services / Frequency Ages 19 to 39  Blood  pressure check.** / Every 1 to 2 years.  Lipid and cholesterol check.** / Every 5 years beginning at age 20.  Clinical breast exam.** / Every 3 years for women in their 20s and 30s.  Pap test.** / Every 2 years from ages 21 through 29. Every 3 years starting at age 30 through age 65 or 70 with a history of 3 consecutive normal Pap tests.  HPV screening.** / Every 3 years from ages 30 through ages 65 to 70 with a history of 3 consecutive normal Pap tests.  Hepatitis C blood test.** / For any individual with known risks for hepatitis C.  Skin self-exam. / Monthly.  Influenza immunization.** / Every year.  Pneumococcal polysaccharide immunization.** / 1 to 2 doses if you smoke cigarettes or if you have certain chronic medical conditions.  Tetanus, diphtheria, pertussis (Tdap, Td) immunization. / A one-time dose of Tdap vaccine. After that, you need a Td booster dose every 10 years.  HPV immunization. / 3 doses over 6 months, if you are 26 and younger.  Measles, mumps, rubella (MMR) immunization. / You need at least 1 dose of MMR if you were born in 1957 or later. You may also need a second dose.  Meningococcal immunization. / 1 dose if you are age 19 to 21 and a first-year college student living in a residence hall, or have one of several medical conditions, you need to get vaccinated against meningococcal disease. You may also need additional booster doses.  Varicella immunization.** / Consult your caregiver.  Hepatitis A immunization.** / Consult your caregiver. 2 doses, 6 to 18 months apart.  Hepatitis B immunization.** / Consult your caregiver. 3 doses usually over 6 months. Ages 40 to 64  Blood pressure check.** / Every 1 to 2 years.  Lipid and cholesterol check.** / Every 5 years beginning at age 20.  Clinical breast exam.** / Every year after age 40.  Mammogram.** / Every year beginning at age 40   and continuing for as long as you are in good health. Consult with your  caregiver.  Pap test.** / Every 3 years starting at age 30 through age 65 or 70 with a history of 3 consecutive normal Pap tests.  HPV screening.** / Every 3 years from ages 30 through ages 65 to 70 with a history of 3 consecutive normal Pap tests.  Fecal occult blood test (FOBT) of stool. / Every year beginning at age 50 and continuing until age 75. You may not need to do this test if you get a colonoscopy every 10 years.  Flexible sigmoidoscopy or colonoscopy.** / Every 5 years for a flexible sigmoidoscopy or every 10 years for a colonoscopy beginning at age 50 and continuing until age 75.  Hepatitis C blood test.** / For all people born from 1945 through 1965 and any individual with known risks for hepatitis C.  Skin self-exam. / Monthly.  Influenza immunization.** / Every year.  Pneumococcal polysaccharide immunization.** / 1 to 2 doses if you smoke cigarettes or if you have certain chronic medical conditions.  Tetanus, diphtheria, pertussis (Tdap, Td) immunization.** / A one-time dose of Tdap vaccine. After that, you need a Td booster dose every 10 years.  Measles, mumps, rubella (MMR) immunization. / You need at least 1 dose of MMR if you were born in 1957 or later. You may also need a second dose.  Varicella immunization.** / Consult your caregiver.  Meningococcal immunization.** / Consult your caregiver.  Hepatitis A immunization.** / Consult your caregiver. 2 doses, 6 to 18 months apart.  Hepatitis B immunization.** / Consult your caregiver. 3 doses, usually over 6 months. Ages 65 and over  Blood pressure check.** / Every 1 to 2 years.  Lipid and cholesterol check.** / Every 5 years beginning at age 20.  Clinical breast exam.** / Every year after age 40.  Mammogram.** / Every year beginning at age 40 and continuing for as long as you are in good health. Consult with your caregiver.  Pap test.** / Every 3 years starting at age 30 through age 65 or 70 with a 3  consecutive normal Pap tests. Testing can be stopped between 65 and 70 with 3 consecutive normal Pap tests and no abnormal Pap or HPV tests in the past 10 years.  HPV screening.** / Every 3 years from ages 30 through ages 65 or 70 with a history of 3 consecutive normal Pap tests. Testing can be stopped between 65 and 70 with 3 consecutive normal Pap tests and no abnormal Pap or HPV tests in the past 10 years.  Fecal occult blood test (FOBT) of stool. / Every year beginning at age 50 and continuing until age 75. You may not need to do this test if you get a colonoscopy every 10 years.  Flexible sigmoidoscopy or colonoscopy.** / Every 5 years for a flexible sigmoidoscopy or every 10 years for a colonoscopy beginning at age 50 and continuing until age 75.  Hepatitis C blood test.** / For all people born from 1945 through 1965 and any individual with known risks for hepatitis C.  Osteoporosis screening.** / A one-time screening for women ages 65 and over and women at risk for fractures or osteoporosis.  Skin self-exam. / Monthly.  Influenza immunization.** / Every year.  Pneumococcal polysaccharide immunization.** / 1 dose at age 65 (or older) if you have never been vaccinated.  Tetanus, diphtheria, pertussis (Tdap, Td) immunization. / A one-time dose of Tdap vaccine if you are over   65 and have contact with an infant, are a healthcare worker, or simply want to be protected from whooping cough. After that, you need a Td booster dose every 10 years.  Varicella immunization.** / Consult your caregiver.  Meningococcal immunization.** / Consult your caregiver.  Hepatitis A immunization.** / Consult your caregiver. 2 doses, 6 to 18 months apart.  Hepatitis B immunization.** / Check with your caregiver. 3 doses, usually over 6 months. ** Family history and personal history of risk and conditions may change your caregiver's recommendations. Document Released: 10/22/2001 Document Revised: 11/18/2011  Document Reviewed: 01/21/2011 [employer] Patient Information 2014 [employer].  

## 2013-04-16 NOTE — Assessment & Plan Note (Signed)
Exam done Tdap was updated Labs ordered Pt ed material was given 

## 2013-04-18 ENCOUNTER — Encounter: Payer: Self-pay | Admitting: Internal Medicine

## 2014-01-03 ENCOUNTER — Encounter: Payer: Self-pay | Admitting: *Deleted

## 2014-01-03 ENCOUNTER — Ambulatory Visit (INDEPENDENT_AMBULATORY_CARE_PROVIDER_SITE_OTHER): Payer: BC Managed Care – PPO | Admitting: Cardiovascular Disease

## 2014-01-03 ENCOUNTER — Encounter (INDEPENDENT_AMBULATORY_CARE_PROVIDER_SITE_OTHER): Payer: BC Managed Care – PPO

## 2014-01-03 ENCOUNTER — Encounter: Payer: Self-pay | Admitting: Cardiovascular Disease

## 2014-01-03 VITALS — BP 140/92 | HR 86 | Ht 66.0 in | Wt 190.0 lb

## 2014-01-03 DIAGNOSIS — R079 Chest pain, unspecified: Secondary | ICD-10-CM | POA: Insufficient documentation

## 2014-01-03 DIAGNOSIS — R011 Cardiac murmur, unspecified: Secondary | ICD-10-CM | POA: Insufficient documentation

## 2014-01-03 DIAGNOSIS — R0609 Other forms of dyspnea: Secondary | ICD-10-CM

## 2014-01-03 DIAGNOSIS — R0989 Other specified symptoms and signs involving the circulatory and respiratory systems: Secondary | ICD-10-CM

## 2014-01-03 DIAGNOSIS — R002 Palpitations: Secondary | ICD-10-CM

## 2014-01-03 DIAGNOSIS — R0600 Dyspnea, unspecified: Secondary | ICD-10-CM | POA: Insufficient documentation

## 2014-01-03 NOTE — Assessment & Plan Note (Signed)
Etiology not clear  Echo to assess RV/LV function especially with murmur

## 2014-01-03 NOTE — Assessment & Plan Note (Signed)
Seem benign but daily Event monitor

## 2014-01-03 NOTE — Progress Notes (Signed)
Patient ID: Courtney [name], female   DOB: 12/11/1980, 33 y.o.   MRN: [id number] E-Cardio 30 day cardiac event monitor applied to patient.

## 2014-01-03 NOTE — Progress Notes (Signed)
Patient ID: Courtney [name], female   DOB: [birth year], 33 y.o.   MRN: [id number] negative    33 yo referred for palpitations dyspnea chest pain She has a family history of CAD.  She is under some stress with an autistic child.  [place]oved [place] of living Working at [employer] years Symptoms worse when not at work.  Feels her heart skip beats especially at night.  Better with exercise. No presyncope.  Two children with uncomplicated pregnancy.  No excess stimulants with no ETOH or drugs.  Feels like she is suffocating and neck is very sensitive No wheezing  No history of lung disease or bad allergies.   Symptoms for the last 6 weeks or so.  No family history of HOCM or other congenital disease  TSH normal 8/14     ROS: Denies fever, malais, weight loss, blurry vision, decreased visual acuity, cough, sputum, SOB, hemoptysis, pleuritic pain, palpitaitons, heartburn, abdominal pain, melena, lower extremity edema, claudication, or rash.  All other systems reviewed and negative   General: Affect appropriate Healthy:  appears stated age HEENT: normal Neck supple with no adenopathy JVP normal left bruits no thyromegaly Lungs clear with no wheezing and good diaphragmatic motion Heart:  S1/S2 SEM increase with valsalva murmur,rub, gallop or click PMI normal Abdomen: benighn, BS positve, no tenderness, no AAA no bruit.  No HSM or HJR Distal pulses intact with no bruits No edema Neuro non-focal Skin warm and dry No muscular weakness  Medications Current Outpatient Prescriptions  Medication Sig Dispense Refill  . adapalene (DIFFERIN) 0.1 % cream       . OCELLA 3-0.03 MG tablet        No current facility-administered medications for this visit.    Allergies Review of patient's allergies indicates no known allergies.  Family History: Family History  Problem Relation Age of Onset  . Cancer Other     Breast Cancer  . Early death Neg Hx   . Heart disease Neg Hx   . Hypertension Neg  Hx   . Kidney disease Neg Hx   . Stroke Neg Hx     Social History: History   Social History  . Marital Status: Married    Spouse Name: N/A    Number of Children: N/A  . Years of Education: N/A   Occupational History  . Not on file.   Social History Main Topics  . Smoking status: Never Smoker   . Smokeless tobacco: Never Used  . Alcohol Use: 3.6 oz/week    6 Cans of beer per week  . Drug Use: No  . Sexual Activity: Yes    Birth Control/ Protection: Surgical   Other Topics Concern  . Not on file   Social History Narrative  . No narrative on file    Electrocardiogram:  NSR normal ECG rate 86   Assessment and Plan  e

## 2014-01-03 NOTE — Patient Instructions (Signed)
Your physician recommends that you schedule a follow-up appointment in:  NEXT   AVAILABLE   3-4 WEEKS Your physician recommends that you continue on your current medications as directed. Please refer to the Current Medication list given to you today.  Your physician has requested that you have an echocardiogram. Echocardiography is a painless test that uses sound waves to create images of your heart. It provides your doctor with information about the size and shape of your heart and how well your heart's chambers and valves are working. This procedure takes approximately one hour. There are no restrictions for this procedure.  Your physician has requested that you have a carotid duplex. This test is an ultrasound of the carotid arteries in your neck. It looks at blood flow through these arteries that supply the brain with blood. Allow one hour for this exam. There are no restrictions or special instructions.  Your physician has requested that you have an exercise tolerance test. For further information please visit [email or web address]. Please also follow instruction sheet, as given. Your physician has recommended that you wear an event monitor. Event monitors are medical devices that record the heart's electrical activity. Doctors most often us these monitors to diagnose arrhythmias. Arrhythmias are problems with the speed or rhythm of the heartbeat. The monitor is a small, portable device. You can wear one while you do your normal daily activities. This is usually used to diagnose what is causing palpitations/syncope (passing out). 30  DAY

## 2014-01-03 NOTE — Assessment & Plan Note (Signed)
Atypical Normal ECG f/u ETT

## 2014-01-03 NOTE — Assessment & Plan Note (Signed)
Does increase with valsalva  Radiates to left carotid  Echo and carotid duplex R.O submembrane and HOCM

## 2014-01-19 ENCOUNTER — Ambulatory Visit (HOSPITAL_COMMUNITY): Payer: BC Managed Care – PPO | Attending: Cardiovascular Disease | Admitting: *Deleted

## 2014-01-19 ENCOUNTER — Ambulatory Visit (HOSPITAL_BASED_OUTPATIENT_CLINIC_OR_DEPARTMENT_OTHER): Payer: BC Managed Care – PPO | Admitting: Radiology

## 2014-01-19 DIAGNOSIS — R011 Cardiac murmur, unspecified: Secondary | ICD-10-CM

## 2014-01-19 DIAGNOSIS — R0989 Other specified symptoms and signs involving the circulatory and respiratory systems: Secondary | ICD-10-CM

## 2014-01-19 NOTE — Progress Notes (Signed)
Echocardiogram performed.  

## 2014-01-19 NOTE — Progress Notes (Signed)
Carotid duplex complete 

## 2014-01-24 NOTE — Progress Notes (Signed)
Quick Note:  Patient notified of Carotid Doppler study results - "Normal carotids", per Dr. Eden EmmsNishan. Patient verbalized understanding and appreciation for phone call. ______

## 2014-02-02 ENCOUNTER — Ambulatory Visit (INDEPENDENT_AMBULATORY_CARE_PROVIDER_SITE_OTHER): Payer: BC Managed Care – PPO | Admitting: Cardiovascular Disease

## 2014-02-02 DIAGNOSIS — R0600 Dyspnea, unspecified: Secondary | ICD-10-CM

## 2014-02-02 DIAGNOSIS — R0989 Other specified symptoms and signs involving the circulatory and respiratory systems: Secondary | ICD-10-CM

## 2014-02-02 DIAGNOSIS — R0609 Other forms of dyspnea: Secondary | ICD-10-CM

## 2014-02-02 DIAGNOSIS — R079 Chest pain, unspecified: Secondary | ICD-10-CM

## 2014-02-02 NOTE — Progress Notes (Signed)
Exercise Treadmill Test  Pre-Exercise Testing Evaluation Rhythm: normal sinus  Rate: 81 bpm     Test  Exercise Tolerance Test Ordering MD: Charlton Haws, MD  Interpreting MD: Charlton Haws, MD  Unique Test No: 1  Treadmill:  1  Indication for ETT: chest pain - rule out ischemia  Contraindication to ETT: No   Stress Modality: exercise - treadmill  Cardiac Imaging Performed: non   Protocol: standard Bruce - maximal  Max BP:  188 88 /   Max MPHR (bpm):  185 85% MPR (bpm):  157  MPHR obtained (bpm):  180 % MPHR obtained:    Reached 85% MPHR (min:sec):  6:38 Total Exercise Time (min-sec):  8:31  Workload in METS:  14.3 Borg Scale: 18  Reason ETT Terminated:  fatigue    ST Segment Analysis At Rest: normal ST segments - no evidence of significant ST depression With Exercise: no evidence of significant ST depression  Other Information Arrhythmia:  No Angina during ETT:  absent (0) Quality of ETT:  diagnostic  ETT Interpretation:  normal - no evidence of ischemia by ST analysis  Comments: Normal ETT  Recommendations: Observe

## 2014-02-17 ENCOUNTER — Telehealth: Payer: Self-pay | Admitting: *Deleted

## 2014-02-17 NOTE — Telephone Encounter (Signed)
LEFT MESSAGE  FOR  PT  TO CALL BACK TO GIVE  NORMAL  GXT RESULTS./CY

## 2014-03-02 NOTE — Telephone Encounter (Signed)
LEFT  VOICE MAIL  THAT   GXT WAS NORMAL./CY

## 2014-03-03 ENCOUNTER — Telehealth: Payer: Self-pay | Admitting: *Deleted

## 2014-03-03 NOTE — Telephone Encounter (Signed)
LEFT  VOICE  MAIL   RE   MONITOR  RESULTS  PER  DR NISHAN   NSR  ./CY 

## 2014-03-29 ENCOUNTER — Ambulatory Visit (INDEPENDENT_AMBULATORY_CARE_PROVIDER_SITE_OTHER): Payer: BC Managed Care – PPO | Admitting: Family Medicine

## 2014-03-29 ENCOUNTER — Encounter: Payer: Self-pay | Admitting: Family Medicine

## 2014-03-29 VITALS — BP 130/90 | HR 78 | Temp 99.3°F | Ht 66.0 in | Wt 192.0 lb

## 2014-03-29 DIAGNOSIS — J069 Acute upper respiratory infection, unspecified: Secondary | ICD-10-CM

## 2014-03-29 NOTE — Progress Notes (Signed)
Pre visit review using our clinic review tool, if applicable. No additional management support is needed unless otherwise documented below in the visit note. 

## 2014-03-29 NOTE — Progress Notes (Signed)
No chief complaint on file.   HPI:  -started: 3-4 days ago -symptoms:pnd, sore throat -denies:fever, SOB, NVD, tooth pain -has tried: nothing -sick contacts/travel/risks: denies flu exposure, tick exposure or or Ebola risks  ROS: See pertinent positives and negatives per HPI.  No past medical history on file.  Past Surgical History  Procedure Laterality Date  . Tubal ligation    . Cesarean section    . Knee surgery      Family History  Problem Relation Age of Onset  . Cancer Other     Breast Cancer  . Early death Neg Hx   . Heart disease Neg Hx   . Hypertension Neg Hx   . Kidney disease Neg Hx   . Stroke Neg Hx     History   Social History  . Marital Status: Married    Spouse Name: N/A    Number of Children: N/A  . Years of Education: N/A   Social History Main Topics  . Smoking status: Never Smoker   . Smokeless tobacco: Never Used  . Alcohol Use: 3.6 oz/week    6 Cans of beer per week  . Drug Use: No  . Sexual Activity: Yes    Birth Control/ Protection: Surgical   Other Topics Concern  . None   Social History Narrative  . None    Current outpatient prescriptions:adapalene (DIFFERIN) 0.1 % cream, , Disp: , Rfl: ;  FERROUS SULFATE ER PO, Take by mouth., Disp: , Rfl: ;  Multiple Vitamin (MULTIVITAMIN) capsule, Take 1 capsule by mouth daily., Disp: , Rfl:   EXAM:  Filed Vitals:   03/29/14 1054  BP: 130/90  Pulse: 78  Temp: 99.3 F (37.4 C)    Body mass index is 31 kg/(m^2).  GENERAL: vitals reviewed and listed above, alert, oriented, appears well hydrated and in no acute distress  HEENT: atraumatic, conjunttiva clear, no obvious abnormalities on inspection of external nose and ears, normal appearance of ear canals and TMs, clear nasal congestion, mild post oropharyngeal erythema with PND, no tonsillar edema or exudate, no sinus TTP  NECK: no obvious masses on inspection  LUNGS: clear to auscultation bilaterally, no wheezes, rales or rhonchi,  good air movement  CV: HRRR, no peripheral edema  MS: moves all extremities without noticeable abnormality  PSYCH: pleasant and cooperative, no obvious depression or anxiety  ASSESSMENT AND PLAN:  Discussed the following assessment and plan:  Viral upper respiratory infection  -given HPI and exam findings today, a serious infection or illness is unlikely. We discussed potential etiologies, with VURI being most likely, and advised supportive care and monitoring. We discussed treatment side effects, likely course, antibiotic misuse, transmission, and signs of developing a serious illness. -of course, we advised to return or notify a doctor immediately if symptoms worsen or persist or new concerns arise.    Patient Instructions  INSTRUCTIONS FOR UPPER RESPIRATORY INFECTION:  -plenty of rest and fluids  -nasal saline wash 2-3 times daily (use prepackaged nasal saline or bottled/distilled water if making your own)   -clean nose with nasal saline before using the nasal steroid or sinex  -can use AFRIN spray for drainage and nasal congestion - but do NOT use longer then 3-4 days  -can use tylenol or ibuprofen as directed for aches and sorethroat  -if you are taking a cough medication - use only as directed, may also try a teaspoon of honey to coat the throat and throat lozenges  -for sore throat, salt water gargles  can help  -follow up if you have fevers, facial pain, tooth pain, difficulty breathing or are worsening or not getting better in 5-7 days      Elexis Pollak, [facility] R.

## 2014-03-29 NOTE — Patient Instructions (Signed)
INSTRUCTIONS FOR UPPER RESPIRATORY INFECTION:  -plenty of rest and fluids  -nasal saline wash 2-3 times daily (use prepackaged nasal saline or bottled/distilled water if making your own)   -clean nose with nasal saline before using the nasal steroid or sinex  -can use AFRIN spray for drainage and nasal congestion - but do NOT use longer then 3-4 days  -can use tylenol or ibuprofen as directed for aches and sorethroat  -if you are taking a cough medication - use only as directed, may also try a teaspoon of honey to coat the throat and throat lozenges  -for sore throat, salt water gargles can help  -follow up if you have fevers, facial pain, tooth pain, difficulty breathing or are worsening or not getting better in 5-7 days

## 2022-03-26 ENCOUNTER — Ambulatory Visit
Admission: RE | Admit: 2022-03-26 | Discharge: 2022-03-26 | Disposition: A | Discharge status: Home / Self Care | Payer: BC Managed Care – PPO | Source: Ambulatory Visit | Attending: Physician Assistant | Admitting: Physician Assistant

## 2022-03-26 ENCOUNTER — Other Ambulatory Visit: Payer: Self-pay

## 2022-03-26 VITALS — BP 138/96 | HR 79 | Temp 98.1°F | Resp 20

## 2022-03-26 DIAGNOSIS — R12 Heartburn: Secondary | ICD-10-CM | POA: Diagnosis not present

## 2022-03-26 MED ORDER — LANSOPRAZOLE 30 MG PO CPDR: 30.0000 mg | DELAYED_RELEASE_CAPSULE | Freq: Every day | ORAL | 1 refills | Status: AC

## 2022-03-26 NOTE — ED Provider Notes (Signed)
[facility]    CSN: [id number] Arrival date & time: 03/26/22  1055      History   Chief Complaint Chief Complaint  Patient presents with   Abdominal Pain    Entered by patient   Appointment    11:00    HPI Courtney [name] is a 41 y.o. female.   Patient reports she has been experiencing heartburn on and off for the last week.  Patient reports she has never had heartburn in the past.  She reports some discomfort in the epigastric and upper abdomen associated with the heartburn.  Patient reports she tried over-the-counter medications without relief.  Patient denies any fever or chills she denies any cough or congestion she has not any chest pain she denies any nausea vomiting or diarrhea.  The history is provided by the patient.  Abdominal Pain   History reviewed. No pertinent past medical history.  Patient Active Problem List   Diagnosis Date Noted   Dyspnea 01/03/2014   Palpitations 01/03/2014   Chest pain 01/03/2014   Murmur 01/03/2014   Anemia 04/16/2013   Routine general medical examination at a health care facility 04/16/2013    Past Surgical History:  Procedure Laterality Date   CESAREAN SECTION     KNEE SURGERY     TUBAL LIGATION      OB History   No obstetric history on file.      Home Medications    Prior to Admission medications   Medication Sig Start Date End Date Taking? Authorizing Provider  lansoprazole (PREVACID) 30 MG capsule Take 1 capsule (30 mg total) by mouth daily. 03/26/22 03/26/23 Yes Caryl Ada K, PA-C  adapalene (DIFFERIN) 0.1 % cream  03/20/13   [provider]  FERROUS SULFATE ER PO Take by mouth.    [provider]  Multiple Vitamin (MULTIVITAMIN) capsule Take 1 capsule by mouth daily.    [provider]    Family History Family History  Problem Relation Age of Onset   Cancer Other        Breast Cancer   Early death Neg Hx    Heart disease Neg Hx    Hypertension Neg Hx    Kidney  disease Neg Hx    Stroke Neg Hx     Social History Social History   Tobacco Use   Smoking status: Never   Smokeless tobacco: Never  Substance Use Topics   Alcohol use: Yes    Alcohol/week: 6.0 standard drinks of alcohol    Types: 6 Cans of beer per week   Drug use: No     Allergies   Patient has no known allergies.   Review of Systems Review of Systems  Gastrointestinal:  Positive for abdominal pain.  All other systems reviewed and are negative.    Physical Exam Triage Vital Signs ED Triage Vitals  Enc Vitals Group     BP 03/26/22 1109 (!) 138/96     Pulse Rate 03/26/22 1109 79     Resp 03/26/22 1109 20     Temp 03/26/22 1109 98.1 F (36.7 C)     Temp Source 03/26/22 1109 Oral     SpO2 03/26/22 1109 100 %     Weight --      Height --      Head Circumference --      Peak Flow --      Pain Score 03/26/22 1106 0     Pain Loc --  Pain Edu? --      Excl. in [place]? --    No data found.  Updated Vital Signs BP (!) 138/96 (BP Location: Left Arm) Comment (BP Location): large cuff  Pulse 79   Temp 98.1 F (36.7 C) (Oral)   Resp 20   LMP 02/20/2022   SpO2 100%   Visual Acuity Right Eye Distance:   Left Eye Distance:   Bilateral Distance:    Right Eye Near:   Left Eye Near:    Bilateral Near:     Physical Exam Vitals and nursing note reviewed.  Constitutional:      Appearance: She is well-developed.  HENT:     Head: Normocephalic.  Cardiovascular:     Rate and Rhythm: Normal rate.  Pulmonary:     Effort: Pulmonary effort is normal.  Abdominal:     General: Bowel sounds are normal. There is no distension.     Palpations: Abdomen is soft.     Tenderness: There is abdominal tenderness.     Hernia: No hernia is present.     Comments: Patient is slight tenderness in the epigastric region and right upper quadrant  Musculoskeletal:        General: Normal range of motion.     Cervical back: Normal range of motion.  Skin:    General: Skin is warm.   Neurological:     General: No focal deficit present.     Mental Status: She is alert and oriented to person, place, and time.      UC Treatments / Results  Labs (all labs ordered are listed, but only abnormal results are displayed) Labs Reviewed  CBC WITH DIFFERENTIAL/PLATELET  COMPREHENSIVE METABOLIC PANEL  LIPASE    EKG   Radiology No results found.  Procedures Procedures (including critical care time)  Medications Ordered in UC Medications - No data to display  Initial Impression / Assessment and Plan / UC Course  I have reviewed the triage vital signs and the nursing notes.  Pertinent labs & imaging results that were available during my care of the patient were reviewed by me and considered in my medical decision making (see chart for details).     MDM: I think patient's symptoms are most consistent with a gastritis/heartburn ordered a CBC c-Met and lipase to evaluate for possible liver/gallbladder problem patient is given a prescription for Prevacid she is advised to schedule appointment with primary care for follow-up she is to return if any problems Final Clinical Impressions(s) / UC Diagnoses   Final diagnoses:  Heartburn     Discharge Instructions      Schedule follow up at primary care   ED Prescriptions     Medication Sig Dispense Auth. Provider   lansoprazole (PREVACID) 30 MG capsule Take 1 capsule (30 mg total) by mouth daily. 30 capsule Fransico Meadow, [place]      PDMP not reviewed this encounter.   Fransico Meadow, [place] 03/26/22 1452

## 2022-03-26 NOTE — Discharge Instructions (Signed)
Schedule follow up at primary care

## 2022-03-26 NOTE — ED Triage Notes (Signed)
Burning sensation in stomach, nausea, no vomiting, but was spitting up.  Patient says heart burn, but touches generalized abdomen as location of pain.  Issues started Saturday.  Denies any issues today

## 2022-03-27 LAB — CBC WITH DIFFERENTIAL/PLATELET
Basophils Absolute: 0 10*3/uL (ref 0.0–0.2)
Basos: 0 %
EOS (ABSOLUTE): 0.1 10*3/uL (ref 0.0–0.4)
Eos: 2 %
Hematocrit: 40.2 % (ref 34.0–46.6)
Hemoglobin: 13 g/dL (ref 11.1–15.9)
Immature Grans (Abs): 0 10*3/uL (ref 0.0–0.1)
Immature Granulocytes: 0 %
Lymphocytes Absolute: 1.6 10*3/uL (ref 0.7–3.1)
Lymphs: 24 %
MCH: 28.6 pg (ref 26.6–33.0)
MCHC: 32.3 g/dL (ref 31.5–35.7)
MCV: 88 fL (ref 79–97)
Monocytes Absolute: 0.6 10*3/uL (ref 0.1–0.9)
Monocytes: 9 %
Neutrophils Absolute: 4.4 10*3/uL (ref 1.4–7.0)
Neutrophils: 65 %
Platelets: 276 10*3/uL (ref 150–450)
RBC: 4.55 x10E6/uL (ref 3.77–5.28)
RDW: 12.5 % (ref 11.7–15.4)
WBC: 6.7 10*3/uL (ref 3.4–10.8)

## 2022-03-27 LAB — COMPREHENSIVE METABOLIC PANEL
ALT: 14 IU/L (ref 0–32)
AST: 16 IU/L (ref 0–40)
Albumin/Globulin Ratio: 1.8 (ref 1.2–2.2)
Albumin: 4.5 g/dL (ref 3.9–4.9)
Alkaline Phosphatase: 60 IU/L (ref 44–121)
BUN/Creatinine Ratio: 17 (ref 9–23)
BUN: 18 mg/dL (ref 6–24)
Bilirubin Total: 0.2 mg/dL (ref 0.0–1.2)
CO2: 23 mmol/L (ref 20–29)
Calcium: 9.5 mg/dL (ref 8.7–10.2)
Chloride: 105 mmol/L (ref 96–106)
Creatinine, Ser: 1.07 mg/dL — ABNORMAL HIGH (ref 0.57–1.00)
Globulin, Total: 2.5 g/dL (ref 1.5–4.5)
Glucose: 72 mg/dL (ref 70–99)
Potassium: 4.3 mmol/L (ref 3.5–5.2)
Sodium: 140 mmol/L (ref 134–144)
Total Protein: 7 g/dL (ref 6.0–8.5)
eGFR: 66 mL/min/{1.73_m2} (ref >59)

## 2022-03-27 LAB — LIPASE: Lipase: 31 U/L (ref 14–72)

## 2022-04-25 ENCOUNTER — Encounter: Payer: Self-pay | Admitting: Family Medicine

## 2022-04-25 ENCOUNTER — Ambulatory Visit (INDEPENDENT_AMBULATORY_CARE_PROVIDER_SITE_OTHER): Payer: BC Managed Care – PPO | Admitting: Family Medicine

## 2022-04-25 VITALS — BP 113/79 | HR 81 | Temp 98.3°F | Resp 16 | Ht 65.75 in | Wt 207.0 lb

## 2022-04-25 DIAGNOSIS — Z130 Encounter for screening for diseases of the blood and blood-forming organs and certain disorders involving the immune mechanism: Secondary | ICD-10-CM

## 2022-04-25 DIAGNOSIS — Z0000 Encounter for general adult medical examination without abnormal findings: Secondary | ICD-10-CM

## 2022-04-25 DIAGNOSIS — Z7689 Persons encountering health services in other specified circumstances: Secondary | ICD-10-CM

## 2022-04-25 DIAGNOSIS — Z1159 Encounter for screening for other viral diseases: Secondary | ICD-10-CM

## 2022-04-25 DIAGNOSIS — Z13220 Encounter for screening for lipoid disorders: Secondary | ICD-10-CM

## 2022-04-25 DIAGNOSIS — Z114 Encounter for screening for human immunodeficiency virus [HIV]: Secondary | ICD-10-CM

## 2022-04-25 NOTE — Progress Notes (Signed)
.  Pt presents to establish care,  -request physical -has not seen PCP in approx 5-6 years

## 2022-04-25 NOTE — Progress Notes (Signed)
New Patient Office Visit  Subjective    Patient ID: Courtney [name], female    DOB: 04/09/1981  Age: 41 y.o. MRN: [id number]  CC:  Chief Complaint  Patient presents with   Establish Care    HPI SHELITA STEPTOE presents to establish care and for routine annual exam. Patient denies acute complaints or concerns.    Outpatient Encounter Medications as of 04/25/2022  Medication Sig   adapalene (DIFFERIN) 0.1 % cream    FERROUS SULFATE ER PO Take by mouth.   lansoprazole (PREVACID) 30 MG capsule Take 1 capsule (30 mg total) by mouth daily.   Multiple Vitamin (MULTIVITAMIN) capsule Take 1 capsule by mouth daily.   No facility-administered encounter medications on file as of 04/25/2022.    No past medical history on file.  Past Surgical History:  Procedure Laterality Date   CESAREAN SECTION     KNEE SURGERY     TUBAL LIGATION      Family History  Problem Relation Age of Onset   Cancer Other        Breast Cancer   Early death Neg Hx    Heart disease Neg Hx    Hypertension Neg Hx    Kidney disease Neg Hx    Stroke Neg Hx     Social History   Socioeconomic History   Marital status: Married    Spouse name: Not on file   Number of children: Not on file   Years of education: Not on file   Highest education level: Not on file  Occupational History   Not on file  Tobacco Use   Smoking status: Never    Passive exposure: Never   Smokeless tobacco: Never  Vaping Use   Vaping Use: Not on file  Substance and Sexual Activity   Alcohol use: Yes    Alcohol/week: 6.0 standard drinks of alcohol    Types: 6 Cans of beer per week   Drug use: No   Sexual activity: Yes    Birth control/protection: Surgical  Other Topics Concern   Not on file  Social History Narrative   Not on file   Social Determinants of Health   Financial Resource Strain: Not on file  Food Insecurity: Not on file  Transportation Needs: Not on file  Physical Activity: Not on file  Stress: Not on  file  Social Connections: Not on file  Intimate Partner Violence: Not on file    Review of Systems  All other systems reviewed and are negative.       Objective    BP 113/79 (BP Location: Left Arm, Patient Position: Sitting, Cuff Size: Normal)   Pulse 81   Temp 98.3 F (36.8 C)   Resp 16   Ht 5' 5.75" (1.67 m)   Wt 207 lb (93.9 kg)   LMP 02/20/2022   SpO2 98%   BMI 33.67 kg/m   Physical Exam Vitals and nursing note reviewed.  Constitutional:      General: She is not in acute distress. HENT:     Head: Normocephalic and atraumatic.     Right Ear: Tympanic membrane, ear canal and external ear normal.     Left Ear: Tympanic membrane, ear canal and external ear normal.     Nose: Nose normal.     Mouth/Throat:     Mouth: Mucous membranes are moist.     Pharynx: Oropharynx is clear.  Eyes:     Conjunctiva/sclera: Conjunctivae normal.     Pupils:  Pupils are equal, round, and reactive to light.  Neck:     Thyroid: No thyromegaly.  Cardiovascular:     Rate and Rhythm: Normal rate and regular rhythm.     Heart sounds: Normal heart sounds. No murmur heard. Pulmonary:     Effort: Pulmonary effort is normal. No respiratory distress.     Breath sounds: Normal breath sounds.  Abdominal:     General: There is no distension.     Palpations: Abdomen is soft. There is no mass.     Tenderness: There is no abdominal tenderness.  Musculoskeletal:        General: Normal range of motion.     Cervical back: Normal range of motion and neck supple.  Skin:    General: Skin is warm and dry.  Neurological:     General: No focal deficit present.     Mental Status: She is alert and oriented to person, place, and time.  Psychiatric:        Mood and Affect: Mood normal.        Behavior: Behavior normal.         Assessment & Plan:   1. Annual physical exam  - CMP14+EGFR  2. Screening for deficiency anemia  - CBC with Differential  3. Screening for lipid disorders  -  Lipid Panel  4. Screening for endocrine/metabolic/immunity disorders  - TSH - Hemoglobin A1c  5. Screening for HIV (human immunodeficiency virus)  - HIV antibody (with reflex)  6. Need for hepatitis C screening test  - Hepatitis C Antibody  7. Encounter to establish care   No follow-ups on file.   Becky Sax, MD

## 2022-04-26 LAB — CBC WITH DIFFERENTIAL/PLATELET
Basophils Absolute: 0.1 10*3/uL (ref 0.0–0.2)
Basos: 1 %
EOS (ABSOLUTE): 0.1 10*3/uL (ref 0.0–0.4)
Eos: 2 %
Hematocrit: 38 % (ref 34.0–46.6)
Hemoglobin: 12.6 g/dL (ref 11.1–15.9)
Immature Grans (Abs): 0 10*3/uL (ref 0.0–0.1)
Immature Granulocytes: 0 %
Lymphocytes Absolute: 1.5 10*3/uL (ref 0.7–3.1)
Lymphs: 21 %
MCH: 28.8 pg (ref 26.6–33.0)
MCHC: 33.2 g/dL (ref 31.5–35.7)
MCV: 87 fL (ref 79–97)
Monocytes Absolute: 0.6 10*3/uL (ref 0.1–0.9)
Monocytes: 9 %
Neutrophils Absolute: 4.9 10*3/uL (ref 1.4–7.0)
Neutrophils: 67 %
Platelets: 267 10*3/uL (ref 150–450)
RBC: 4.38 x10E6/uL (ref 3.77–5.28)
RDW: 12.8 % (ref 11.7–15.4)
WBC: 7.1 10*3/uL (ref 3.4–10.8)

## 2022-04-26 LAB — HEMOGLOBIN A1C
Est. average glucose Bld gHb Est-mCnc: 103 mg/dL
Hgb A1c MFr Bld: 5.2 % (ref 4.8–5.6)

## 2022-04-26 LAB — CMP14+EGFR
ALT: 13 IU/L (ref 0–32)
AST: 17 IU/L (ref 0–40)
Albumin/Globulin Ratio: 1.8 (ref 1.2–2.2)
Albumin: 4.4 g/dL (ref 3.9–4.9)
Alkaline Phosphatase: 53 IU/L (ref 44–121)
BUN/Creatinine Ratio: 18 (ref 9–23)
BUN: 15 mg/dL (ref 6–24)
Bilirubin Total: 0.4 mg/dL (ref 0.0–1.2)
CO2: 22 mmol/L (ref 20–29)
Calcium: 9.1 mg/dL (ref 8.7–10.2)
Chloride: 105 mmol/L (ref 96–106)
Creatinine, Ser: 0.84 mg/dL (ref 0.57–1.00)
Globulin, Total: 2.4 g/dL (ref 1.5–4.5)
Glucose: 71 mg/dL (ref 70–99)
Potassium: 4.3 mmol/L (ref 3.5–5.2)
Sodium: 141 mmol/L (ref 134–144)
Total Protein: 6.8 g/dL (ref 6.0–8.5)
eGFR: 88 mL/min/{1.73_m2} (ref >59)

## 2022-04-26 LAB — LIPID PANEL
Chol/HDL Ratio: 2 ratio (ref 0.0–4.4)
Cholesterol, Total: 193 mg/dL (ref 100–199)
HDL: 96 mg/dL (ref >39)
LDL Chol Calc (NIH): 89 mg/dL (ref 0–99)
Triglycerides: 40 mg/dL (ref 0–149)
VLDL Cholesterol Cal: 8 mg/dL (ref 5–40)

## 2022-04-26 LAB — HIV ANTIBODY (ROUTINE TESTING W REFLEX): HIV Screen 4th Generation wRfx: NONREACTIVE

## 2022-04-26 LAB — HEPATITIS C ANTIBODY: Hep C Virus Ab: NONREACTIVE

## 2022-04-26 LAB — TSH: TSH: 1.46 u[IU]/mL (ref 0.450–4.500)

## 2022-04-29 ENCOUNTER — Encounter: Payer: Self-pay | Admitting: Family Medicine

## 2024-01-22 ENCOUNTER — Ambulatory Visit (INDEPENDENT_AMBULATORY_CARE_PROVIDER_SITE_OTHER): Admitting: Family Medicine

## 2024-01-22 ENCOUNTER — Other Ambulatory Visit (HOSPITAL_COMMUNITY)
Admission: RE | Admit: 2024-01-22 | Discharge: 2024-01-22 | Disposition: A | Discharge status: Home / Self Care | Source: Ambulatory Visit | Attending: Family Medicine | Admitting: Family Medicine

## 2024-01-22 VITALS — BP 135/93 | HR 76 | Wt 194.4 lb

## 2024-01-22 DIAGNOSIS — Z1211 Encounter for screening for malignant neoplasm of colon: Secondary | ICD-10-CM

## 2024-01-22 DIAGNOSIS — Z130 Encounter for screening for diseases of the blood and blood-forming organs and certain disorders involving the immune mechanism: Secondary | ICD-10-CM | POA: Diagnosis not present

## 2024-01-22 DIAGNOSIS — Z1329 Encounter for screening for other suspected endocrine disorder: Secondary | ICD-10-CM

## 2024-01-22 DIAGNOSIS — Z113 Encounter for screening for infections with a predominantly sexual mode of transmission: Secondary | ICD-10-CM | POA: Insufficient documentation

## 2024-01-22 DIAGNOSIS — Z13228 Encounter for screening for other metabolic disorders: Secondary | ICD-10-CM

## 2024-01-22 DIAGNOSIS — Z01419 Encounter for gynecological examination (general) (routine) without abnormal findings: Secondary | ICD-10-CM | POA: Diagnosis present

## 2024-01-22 DIAGNOSIS — Z0000 Encounter for general adult medical examination without abnormal findings: Secondary | ICD-10-CM

## 2024-01-22 DIAGNOSIS — Z23 Encounter for immunization: Secondary | ICD-10-CM | POA: Diagnosis not present

## 2024-01-22 DIAGNOSIS — Z13220 Encounter for screening for lipoid disorders: Secondary | ICD-10-CM | POA: Diagnosis not present

## 2024-01-22 NOTE — Progress Notes (Unsigned)
 Established Patient Office Visit  Subjective    Patient ID: Courtney [name], female    DOB: 1981/08/12  Age: 43 y.o. MRN: [id number]  CC:  Chief Complaint  Patient presents with   Menopause   Annual Exam    Wanting to check blood work    Gynecologic Exam    HPI Courtney [name] presents for routine annual exam. Patient denies acute complaints.  Outpatient Encounter Medications as of 01/22/2024  Medication Sig   Multiple Vitamin (MULTIVITAMIN) capsule Take 1 capsule by mouth daily.   adapalene (DIFFERIN) 0.1 % cream  (Patient not taking: Reported on 01/22/2024)   FERROUS SULFATE ER PO Take by mouth. (Patient not taking: Reported on 01/22/2024)   lansoprazole  (PREVACID ) 30 MG capsule Take 1 capsule (30 mg total) by mouth daily.   No facility-administered encounter medications on file as of 01/22/2024.    No past medical history on file.  Past Surgical History:  Procedure Laterality Date   CESAREAN SECTION     KNEE SURGERY     TUBAL LIGATION      Family History  Problem Relation Age of Onset   Cancer Other        Breast Cancer   Early death Neg Hx    Heart disease Neg Hx    Hypertension Neg Hx    Kidney disease Neg Hx    Stroke Neg Hx     Social History   Socioeconomic History   Marital status: Married    Spouse name: Not on file   Number of children: Not on file   Years of education: Not on file   Highest education level: GED or equivalent  Occupational History   Not on file  Tobacco Use   Smoking status: Never    Passive exposure: Never   Smokeless tobacco: Never  Vaping Use   Vaping status: Not on file  Substance and Sexual Activity   Alcohol use: Yes    Alcohol/week: 6.0 standard drinks of alcohol    Types: 6 Cans of beer per week   Drug use: No   Sexual activity: Yes    Birth control/protection: Surgical  Other Topics Concern   Not on file  Social History Narrative   Not on file   Social Drivers of Health   Financial Resource Strain: Low  Risk  (01/15/2024)   Overall Financial Resource Strain (CARDIA)    Difficulty of Paying Living Expenses: Not hard at all  Food Insecurity: No Food Insecurity (01/15/2024)   Hunger Vital Sign    Worried About Running Out of Food in the Last Year: Never true    Ran Out of Food in the Last Year: Never true  Transportation Needs: No Transportation Needs (01/15/2024)   PRAPARE - Administrator, Civil Service (Medical): No    Lack of Transportation (Non-Medical): No  Physical Activity: Sufficiently Active (01/15/2024)   Exercise Vital Sign    Days of Exercise per Week: 5 days    Minutes of Exercise per Session: 150+ min  Stress: No Stress Concern Present (01/15/2024)   [employer] of Occupational Health - Occupational Stress Questionnaire    Feeling of Stress : Not at all  Social Connections: Moderately Isolated (01/15/2024)   Social Connection and Isolation Panel [NHANES]    Frequency of Communication with Friends and Family: More than three times a week    Frequency of Social Gatherings with Friends and Family: Once a week    Attends Religious Services:  Never    Active Member of Clubs or Organizations: No    Attends Banker Meetings: Not on file    Marital Status: Married  Intimate Partner Violence: Not on file    Review of Systems  All other systems reviewed and are negative.       Objective    BP (!) 135/93   Pulse 76   Wt 194 lb 6.4 oz (88.2 kg)   SpO2 99%   BMI 31.62 kg/m   Physical Exam Vitals and nursing note reviewed.  Constitutional:      General: She is not in acute distress. HENT:     Head: Normocephalic and atraumatic.     Right Ear: Tympanic membrane, ear canal and external ear normal.     Left Ear: Tympanic membrane, ear canal and external ear normal.     Nose: Nose normal.     Mouth/Throat:     Mouth: Mucous membranes are moist.     Pharynx: Oropharynx is clear.  Eyes:     Conjunctiva/sclera: Conjunctivae normal.     Pupils:  Pupils are equal, round, and reactive to light.  Neck:     Thyroid: No thyromegaly.  Cardiovascular:     Rate and Rhythm: Normal rate and regular rhythm.     Heart sounds: Normal heart sounds. No murmur heard. Pulmonary:     Effort: Pulmonary effort is normal. No respiratory distress.     Breath sounds: Normal breath sounds.  Abdominal:     General: There is no distension.     Palpations: Abdomen is soft. There is no mass.     Tenderness: There is no abdominal tenderness.  Genitourinary:    Exam position: Supine.     Labia:        Right: No lesion.        Left: No lesion.      Vagina: Normal.     Cervix: Normal.     Uterus: Normal.      Adnexa: Right adnexa normal and left adnexa normal.  Musculoskeletal:        General: Normal range of motion.     Cervical back: Normal range of motion and neck supple.  Skin:    General: Skin is warm and dry.  Neurological:     General: No focal deficit present.     Mental Status: She is alert and oriented to person, place, and time.  Psychiatric:        Mood and Affect: Mood normal.        Behavior: Behavior normal.         Assessment & Plan:   Annual physical exam -     CMP14+EGFR  Screening for deficiency anemia -     CBC with Differential/Platelet  Screening for lipid disorders -     Lipid panel  Screening for endocrine/metabolic/immunity disorders -     Hemoglobin A1c -     TSH -     VITAMIN D 25 Hydroxy (Vit-D Deficiency, Fractures)  Screening for colon cancer -     Ambulatory referral to Gastroenterology  Pap smear, as part of routine gynecological examination -     Cytology - PAP  Screening for STDs (sexually transmitted diseases) -     Cervicovaginal ancillary only -     Cytology - PAP  Other orders -     Tdap vaccine greater than or equal to 7yo IM     No follow-ups on file.   Abraham Abo  P, MD

## 2024-01-23 ENCOUNTER — Encounter: Payer: Self-pay | Admitting: Family Medicine

## 2024-01-23 LAB — CMP14+EGFR
ALT: 16 IU/L (ref 0–32)
AST: 19 IU/L (ref 0–40)
Albumin: 4.4 g/dL (ref 3.9–4.9)
Alkaline Phosphatase: 77 IU/L (ref 44–121)
BUN/Creatinine Ratio: 22 (ref 9–23)
BUN: 20 mg/dL (ref 6–24)
Bilirubin Total: 0.7 mg/dL (ref 0.0–1.2)
CO2: 23 mmol/L (ref 20–29)
Calcium: 9.3 mg/dL (ref 8.7–10.2)
Chloride: 104 mmol/L (ref 96–106)
Creatinine, Ser: 0.89 mg/dL (ref 0.57–1.00)
Globulin, Total: 2.3 g/dL (ref 1.5–4.5)
Glucose: 73 mg/dL (ref 70–99)
Potassium: 4.1 mmol/L (ref 3.5–5.2)
Sodium: 141 mmol/L (ref 134–144)
Total Protein: 6.7 g/dL (ref 6.0–8.5)
eGFR: 81 mL/min/{1.73_m2} (ref >59)

## 2024-01-23 LAB — CERVICOVAGINAL ANCILLARY ONLY
Bacterial Vaginitis (gardnerella): NEGATIVE
Candida Glabrata: NEGATIVE
Candida Vaginitis: NEGATIVE
Chlamydia: NEGATIVE
Comment: NEGATIVE
Comment: NEGATIVE
Comment: NEGATIVE
Comment: NEGATIVE
Comment: NEGATIVE
Comment: NORMAL
Neisseria Gonorrhea: NEGATIVE
Trichomonas: NEGATIVE

## 2024-01-23 LAB — CBC WITH DIFFERENTIAL/PLATELET
Basophils Absolute: 0 10*3/uL (ref 0.0–0.2)
Basos: 1 %
EOS (ABSOLUTE): 0.1 10*3/uL (ref 0.0–0.4)
Eos: 2 %
Hematocrit: 39.7 % (ref 34.0–46.6)
Hemoglobin: 12.6 g/dL (ref 11.1–15.9)
Immature Grans (Abs): 0 10*3/uL (ref 0.0–0.1)
Immature Granulocytes: 0 %
Lymphocytes Absolute: 1.8 10*3/uL (ref 0.7–3.1)
Lymphs: 27 %
MCH: 28.4 pg (ref 26.6–33.0)
MCHC: 31.7 g/dL (ref 31.5–35.7)
MCV: 89 fL (ref 79–97)
Monocytes Absolute: 0.5 10*3/uL (ref 0.1–0.9)
Monocytes: 8 %
Neutrophils Absolute: 4.1 10*3/uL (ref 1.4–7.0)
Neutrophils: 62 %
Platelets: 251 10*3/uL (ref 150–450)
RBC: 4.44 x10E6/uL (ref 3.77–5.28)
RDW: 13.1 % (ref 11.7–15.4)
WBC: 6.5 10*3/uL (ref 3.4–10.8)

## 2024-01-23 LAB — LIPID PANEL
Chol/HDL Ratio: 2.2 ratio (ref 0.0–4.4)
Cholesterol, Total: 219 mg/dL — ABNORMAL HIGH (ref 100–199)
HDL: 98 mg/dL (ref >39)
LDL Chol Calc (NIH): 114 mg/dL — ABNORMAL HIGH (ref 0–99)
Triglycerides: 39 mg/dL (ref 0–149)
VLDL Cholesterol Cal: 7 mg/dL (ref 5–40)

## 2024-01-23 LAB — TSH: TSH: 1.56 u[IU]/mL (ref 0.450–4.500)

## 2024-01-23 LAB — HEMOGLOBIN A1C
Est. average glucose Bld gHb Est-mCnc: 108 mg/dL
Hgb A1c MFr Bld: 5.4 % (ref 4.8–5.6)

## 2024-01-23 LAB — VITAMIN D 25 HYDROXY (VIT D DEFICIENCY, FRACTURES): Vit D, 25-Hydroxy: 43.2 ng/mL (ref 30.0–100.0)

## 2024-01-26 ENCOUNTER — Ambulatory Visit: Payer: Self-pay | Admitting: Family Medicine

## 2024-01-27 LAB — CYTOLOGY - PAP
Adequacy: ABSENT
Comment: NEGATIVE
Diagnosis: UNDETERMINED — AB
High risk HPV: NEGATIVE

## 2024-02-04 ENCOUNTER — Ambulatory Visit: Admitting: Family Medicine

## 2024-02-04 ENCOUNTER — Encounter: Payer: Self-pay | Admitting: Family Medicine

## 2024-02-04 VITALS — BP 141/101 | HR 75 | Wt 200.0 lb

## 2024-02-04 DIAGNOSIS — I10 Essential (primary) hypertension: Secondary | ICD-10-CM | POA: Diagnosis not present

## 2024-02-04 DIAGNOSIS — R87610 Atypical squamous cells of undetermined significance on cytologic smear of cervix (ASC-US): Secondary | ICD-10-CM | POA: Diagnosis not present

## 2024-02-04 MED ORDER — LOSARTAN POTASSIUM 50 MG PO TABS: 50.0000 mg | ORAL_TABLET | Freq: Every day | ORAL | 0 refills | Status: DC

## 2024-02-04 MED ORDER — LISINOPRIL 10 MG PO TABS: 10.0000 mg | ORAL_TABLET | Freq: Every day | ORAL | 0 refills | Status: DC

## 2024-02-04 NOTE — Progress Notes (Unsigned)
 Established Patient Office Visit  Subjective    Patient ID: Courtney [name], female    DOB: [birth year]  Age: 43 y.o. MRN: [id number]  CC:  Chief Complaint  Patient presents with   Medical Management of Chronic Issues    Go over pap results     HPI Courtney [name] presents to follow up on pap results. Patient denies acute complaints.   Outpatient Encounter Medications as of 02/04/2024  Medication Sig   losartan  (COZAAR ) 50 MG tablet Take 1 tablet (50 mg total) by mouth daily.   Multiple Vitamin (MULTIVITAMIN) capsule Take 1 capsule by mouth daily.   [DISCONTINUED] lisinopril  (ZESTRIL ) 10 MG tablet Take 1 tablet (10 mg total) by mouth daily.   adapalene (DIFFERIN) 0.1 % cream  (Patient not taking: Reported on 02/04/2024)   FERROUS SULFATE ER PO Take by mouth. (Patient not taking: Reported on 02/04/2024)   lansoprazole  (PREVACID ) 30 MG capsule Take 1 capsule (30 mg total) by mouth daily.   No facility-administered encounter medications on file as of 02/04/2024.    History reviewed. No pertinent past medical history.  Past Surgical History:  Procedure Laterality Date   CESAREAN SECTION     KNEE SURGERY     TUBAL LIGATION      Family History  Problem Relation Age of Onset   Cancer Other        Breast Cancer   Early death Neg Hx    Heart disease Neg Hx    Hypertension Neg Hx    Kidney disease Neg Hx    Stroke Neg Hx     Social History   Socioeconomic History   Marital status: Married    Spouse name: Not on file   Number of children: Not on file   Years of education: Not on file   Highest education level: GED or equivalent  Occupational History   Not on file  Tobacco Use   Smoking status: Never    Passive exposure: Never   Smokeless tobacco: Never  Vaping Use   Vaping status: Not on file  Substance and Sexual Activity   Alcohol use: Yes    Alcohol/week: 6.0 standard drinks of alcohol    Types: 6 Cans of beer per week   Drug use: No   Sexual activity: Yes     Birth control/protection: Surgical  Other Topics Concern   Not on file  Social History Narrative   Not on file   Social Drivers of Health   Financial Resource Strain: Low Risk  (01/15/2024)   Overall Financial Resource Strain (CARDIA)    Difficulty of Paying Living Expenses: Not hard at all  Food Insecurity: No Food Insecurity (01/15/2024)   Hunger Vital Sign    Worried About Running Out of Food in the Last Year: Never true    Ran Out of Food in the Last Year: Never true  Transportation Needs: No Transportation Needs (01/15/2024)   PRAPARE - Administrator, Civil Service (Medical): No    Lack of Transportation (Non-Medical): No  Physical Activity: Sufficiently Active (01/15/2024)   Exercise Vital Sign    Days of Exercise per Week: 5 days    Minutes of Exercise per Session: 150+ min  Stress: No Stress Concern Present (01/15/2024)   [employer] of Occupational Health - Occupational Stress Questionnaire    Feeling of Stress : Not at all  Social Connections: Moderately Isolated (01/15/2024)   Social Connection and Isolation Panel [NHANES]    Frequency  of Communication with Friends and Family: More than three times a week    Frequency of Social Gatherings with Friends and Family: Once a week    Attends Religious Services: Never    Database administrator or Organizations: No    Attends Engineer, structural: Not on file    Marital Status: Married  Catering manager Violence: Not on file    Review of Systems  All other systems reviewed and are negative.       Objective    BP (!) 141/101 (BP Location: Left Arm, Patient Position: Sitting, Cuff Size: Large)   Pulse 75   Wt 200 lb (90.7 kg)   SpO2 99%   BMI 32.53 kg/m   Physical Exam Vitals and nursing note reviewed.  Constitutional:      General: She is not in acute distress. Cardiovascular:     Rate and Rhythm: Normal rate and regular rhythm.  Pulmonary:     Effort: Pulmonary effort is normal.      Breath sounds: Normal breath sounds.  Abdominal:     General: There is no distension.     Palpations: Abdomen is soft.  Neurological:     General: No focal deficit present.     Mental Status: She is alert and oriented to person, place, and time.         Assessment & Plan:   ASCUS of cervix with negative high risk HPV -     Ambulatory referral to Obstetrics / Gynecology  Essential hypertension  Other orders -     Losartan Potassium; Take 1 tablet (50 mg total) by mouth daily.  Dispense: 90 tablet; Refill: 0     Return in about 4 weeks (around 03/03/2024) for follow up, chronic med issues.   Arlo Lama, MD

## 2024-02-05 ENCOUNTER — Encounter: Payer: Self-pay | Admitting: Family Medicine

## 2024-03-02 ENCOUNTER — Ambulatory Visit (AMBULATORY_SURGERY_CENTER)

## 2024-03-02 ENCOUNTER — Encounter: Payer: Self-pay | Admitting: Internal Medicine

## 2024-03-02 VITALS — Ht 66.0 in | Wt 200.0 lb

## 2024-03-02 DIAGNOSIS — Z1211 Encounter for screening for malignant neoplasm of colon: Secondary | ICD-10-CM

## 2024-03-02 MED ORDER — NA SULFATE-K SULFATE-MG SULF 17.5-3.13-1.6 GM/177ML PO SOLN: 1.0000 | Freq: Once | ORAL | 0 refills | Status: AC

## 2024-03-02 NOTE — Progress Notes (Signed)

## 2024-03-04 ENCOUNTER — Ambulatory Visit: Admitting: Family Medicine

## 2024-03-04 VITALS — BP 126/87 | HR 83 | Temp 98.3°F | Resp 16 | Wt 200.0 lb

## 2024-03-04 DIAGNOSIS — I10 Essential (primary) hypertension: Secondary | ICD-10-CM

## 2024-03-04 NOTE — Progress Notes (Signed)
 Established Patient Office Visit  Subjective    Patient ID: Courtney [name], female    DOB: 01/07/1981  Age: 43 y.o. MRN: [id number]  CC: No chief complaint on file.   HPI Courtney [name] presents for follow up of hypertension. Patient reports med compliance and denies acute complaints.   Outpatient Encounter Medications as of 03/04/2024  Medication Sig   adapalene (DIFFERIN) 0.1 % cream    FERROUS SULFATE ER PO Take by mouth.   lansoprazole  (PREVACID ) 30 MG capsule Take 1 capsule (30 mg total) by mouth daily.   losartan  (COZAAR ) 50 MG tablet Take 1 tablet (50 mg total) by mouth daily.   Multiple Vitamin (MULTIVITAMIN) capsule Take 1 capsule by mouth daily.   No facility-administered encounter medications on file as of 03/04/2024.    No past medical history on file.  Past Surgical History:  Procedure Laterality Date   CESAREAN SECTION     KNEE SURGERY     TUBAL LIGATION      Family History  Problem Relation Age of Onset   Cancer Other        Breast Cancer   Early death Neg Hx    Heart disease Neg Hx    Hypertension Neg Hx    Kidney disease Neg Hx    Stroke Neg Hx    Colon cancer Neg Hx    Rectal cancer Neg Hx    Stomach cancer Neg Hx     Social History   Socioeconomic History   Marital status: Married    Spouse name: Not on file   Number of children: Not on file   Years of education: Not on file   Highest education level: GED or equivalent  Occupational History   Not on file  Tobacco Use   Smoking status: Never    Passive exposure: Never   Smokeless tobacco: Never  Vaping Use   Vaping status: Not on file  Substance and Sexual Activity   Alcohol use: Yes    Alcohol/week: 6.0 standard drinks of alcohol    Types: 6 Cans of beer per week   Drug use: No   Sexual activity: Yes    Birth control/protection: Surgical  Other Topics Concern   Not on file  Social History Narrative   Not on file   Social Drivers of Health   Financial Resource Strain:  Low Risk  (01/15/2024)   Overall Financial Resource Strain (CARDIA)    Difficulty of Paying Living Expenses: Not hard at all  Food Insecurity: No Food Insecurity (01/15/2024)   Hunger Vital Sign    Worried About Running Out of Food in the Last Year: Never true    Ran Out of Food in the Last Year: Never true  Transportation Needs: No Transportation Needs (01/15/2024)   PRAPARE - Administrator, Civil Service (Medical): No    Lack of Transportation (Non-Medical): No  Physical Activity: Sufficiently Active (01/15/2024)   Exercise Vital Sign    Days of Exercise per Week: 5 days    Minutes of Exercise per Session: 150+ min  Stress: No Stress Concern Present (01/15/2024)   [employer] of Occupational Health - Occupational Stress Questionnaire    Feeling of Stress : Not at all  Social Connections: Moderately Isolated (01/15/2024)   Social Connection and Isolation Panel    Frequency of Communication with Friends and Family: More than three times a week    Frequency of Social Gatherings with Friends and Family: Once a  week    Attends Religious Services: Never    Active Member of Clubs or Organizations: No    Attends Engineer, structural: Not on file    Marital Status: Married  Catering manager Violence: Not on file    Review of Systems  All other systems reviewed and are negative.       Objective    BP 126/87   Pulse 83   Temp 98.3 F (36.8 C) (Oral)   Resp 16   Wt 200 lb (90.7 kg)   SpO2 98%   BMI 32.28 kg/m   Physical Exam Vitals and nursing note reviewed.  Constitutional:      General: She is not in acute distress. Cardiovascular:     Rate and Rhythm: Normal rate and regular rhythm.  Pulmonary:     Effort: Pulmonary effort is normal.     Breath sounds: Normal breath sounds.  Abdominal:     Palpations: Abdomen is soft.     Tenderness: There is no abdominal tenderness.  Neurological:     General: No focal deficit present.     Mental Status: She is  alert and oriented to person, place, and time.         Assessment & Plan:   1. Essential hypertension (Primary) Improved. Continue  Return in about 3 months (around 06/04/2024).   Tanda Raguel SQUIBB, MD

## 2024-03-04 NOTE — Progress Notes (Signed)
 Patient is here for 4wk follow-up BP Patent has uncontrolled hypertension Provider is  aware of readings

## 2024-03-11 ENCOUNTER — Encounter: Payer: Self-pay | Admitting: Family Medicine

## 2024-03-21 NOTE — Progress Notes (Unsigned)
 [facility] History and Physical   Primary Care Physician:  Tanda Bleacher, MD   Reason for Procedure:   CRCa screening  Plan:    colonoscopy     HPI: Courtney [name] is a 43 y.o. female referred for a screening colonoscopy exam   Past Medical History:  Diagnosis Date   HTN (hypertension)     Past Surgical History:  Procedure Laterality Date   CESAREAN SECTION     KNEE SURGERY     TUBAL LIGATION      Prior to Admission medications   Medication Sig Start Date End Date Taking? Authorizing Provider  adapalene (DIFFERIN) 0.1 % cream  03/20/13   [provider]  FERROUS SULFATE ER PO Take by mouth.    [provider]  lansoprazole  (PREVACID ) 30 MG capsule Take 1 capsule (30 mg total) by mouth daily. 03/26/22 03/04/24  Sofia, Leslie K, PA-C  losartan  (COZAAR ) 50 MG tablet Take 1 tablet (50 mg total) by mouth daily. 02/04/24   Tanda Bleacher, MD  Multiple Vitamin (MULTIVITAMIN) capsule Take 1 capsule by mouth daily.    [provider]    Current Outpatient Medications  Medication Sig Dispense Refill   FERROUS SULFATE ER PO Take by mouth.     losartan  (COZAAR ) 50 MG tablet Take 1 tablet (50 mg total) by mouth daily. 90 tablet 0   Multiple Vitamin (MULTIVITAMIN) capsule Take 1 capsule by mouth daily.     adapalene (DIFFERIN) 0.1 % cream      lansoprazole  (PREVACID ) 30 MG capsule Take 1 capsule (30 mg total) by mouth daily. 30 capsule 1   Current Facility-Administered Medications  Medication Dose Route Frequency Provider Last Rate Last Admin   0.9 %  sodium chloride  infusion  500 mL Intravenous Continuous Avram Lupita BRAVO, MD        Allergies as of 03/22/2024   (No Known Allergies)    Family History  Problem Relation Age of Onset   Cancer Other        Breast Cancer   Early death Neg Hx    Heart disease Neg Hx    Hypertension Neg Hx    Kidney disease Neg Hx    Stroke Neg Hx    Colon cancer Neg Hx    Rectal cancer Neg Hx     Stomach cancer Neg Hx     Social History   Socioeconomic History   Marital status: Married    Spouse name: Not on file   Number of children: Not on file   Years of education: Not on file   Highest education level: GED or equivalent  Occupational History   Not on file  Tobacco Use   Smoking status: Never    Passive exposure: Never   Smokeless tobacco: Never  Vaping Use   Vaping status: Never Used  Substance and Sexual Activity   Alcohol use: Yes    Alcohol/week: 6.0 standard drinks of alcohol    Types: 6 Cans of beer per week   Drug use: No   Sexual activity: Yes    Birth control/protection: Surgical  Other Topics Concern   Not on file  Social History Narrative   Not on file   Social Drivers of Health   Financial Resource Strain: Low Risk  (01/15/2024)   Overall Financial Resource Strain (CARDIA)    Difficulty of Paying Living Expenses: Not hard at all  Food Insecurity: No Food Insecurity (01/15/2024)   Hunger Vital Sign    Worried  About Running Out of Food in the Last Year: Never true    Ran Out of Food in the Last Year: Never true  Transportation Needs: No Transportation Needs (01/15/2024)   PRAPARE - Administrator, Civil Service (Medical): No    Lack of Transportation (Non-Medical): No  Physical Activity: Sufficiently Active (01/15/2024)   Exercise Vital Sign    Days of Exercise per Week: 5 days    Minutes of Exercise per Session: 150+ min  Stress: No Stress Concern Present (01/15/2024)   [employer] of Occupational Health - Occupational Stress Questionnaire    Feeling of Stress : Not at all  Social Connections: Moderately Isolated (01/15/2024)   Social Connection and Isolation Panel    Frequency of Communication with Friends and Family: More than three times a week    Frequency of Social Gatherings with Friends and Family: Once a week    Attends Religious Services: Never    Database administrator or Organizations: No    Attends Hospital doctor: Not on file    Marital Status: Married  Catering manager Violence: Not on file    Review of Systems:  All other review of systems negative except as mentioned in the HPI.  Physical Exam: Vital signs BP (!) 133/95   Pulse 63   Temp 98.2 F (36.8 C)   Resp 15   Ht 5' 6 (1.676 m)   Wt 200 lb (90.7 kg)   LMP 02/20/2022   SpO2 100%   BMI 32.28 kg/m   General:   Alert,  Well-developed, well-nourished, pleasant and cooperative in NAD Lungs:  Clear throughout to auscultation.   Heart:  Regular rate and rhythm; no murmurs, clicks, rubs,  or gallops. Abdomen:  Soft, nontender and nondistended. Normal bowel sounds.   Neuro/Psych:  Alert and cooperative. Normal mood and affect. A and O x 3   @Destinae Neubecker  CHARLENA Commander, MD, [facility] [phone] (pager) 03/22/2024 9:12 AM@

## 2024-03-22 ENCOUNTER — Encounter: Payer: Self-pay | Admitting: Internal Medicine

## 2024-03-22 ENCOUNTER — Ambulatory Visit (AMBULATORY_SURGERY_CENTER): Admitting: Internal Medicine

## 2024-03-22 VITALS — BP 123/93 | HR 62 | Temp 98.2°F | Resp 14 | Ht 66.0 in | Wt 200.0 lb

## 2024-03-22 DIAGNOSIS — Z1211 Encounter for screening for malignant neoplasm of colon: Secondary | ICD-10-CM | POA: Diagnosis present

## 2024-03-22 DIAGNOSIS — K562 Volvulus: Secondary | ICD-10-CM

## 2024-03-22 MED ORDER — SODIUM CHLORIDE 0.9 % IV SOLN: 500.0000 mL | INTRAVENOUS | Status: DC

## 2024-03-22 NOTE — Progress Notes (Signed)
 Pt's states no medical or surgical changes since previsit or office visit.

## 2024-03-22 NOTE — Progress Notes (Signed)
 Report given to PACU, vss

## 2024-03-22 NOTE — Patient Instructions (Addendum)
 Normal exam - no polyps or cancer seen!  I appreciate the opportunity to care for you. Lupita CHARLENA Commander, MD, FACG YOU HAD AN ENDOSCOPIC PROCEDURE TODAY AT THE  ENDOSCOPY CENTER:   Refer to the procedure report that was given to you for any specific questions about what was found during the examination.  If the procedure report does not answer your questions, please call your gastroenterologist to clarify.  If you requested that your care partner not be given the details of your procedure findings, then the procedure report has been included in a sealed envelope for you to review at your convenience later.  YOU SHOULD EXPECT: Some feelings of bloating in the abdomen. Passage of more gas than usual.  Walking can help get rid of the air that was put into your GI tract during the procedure and reduce the bloating. If you had a lower endoscopy (such as a colonoscopy or flexible sigmoidoscopy) you may notice spotting of blood in your stool or on the toilet paper. If you underwent a bowel prep for your procedure, you may not have a normal bowel movement for a few days.  Please Note:  You might notice some irritation and congestion in your nose or some drainage.  This is from the oxygen used during your procedure.  There is no need for concern and it should clear up in a day or so.  SYMPTOMS TO REPORT IMMEDIATELY:  Following lower endoscopy (colonoscopy or flexible sigmoidoscopy):  Excessive amounts of blood in the stool  Significant tenderness or worsening of abdominal pains  Swelling of the abdomen that is new, acute  Fever of 100F or higher Black, tarry-looking stools  Resume previous diet Continue present medications Repeat colonoscopy in 10 years  For urgent or emergent issues, a gastroenterologist can be reached at any hour by calling [phone]. Do not use MyChart messaging for urgent concerns.    DIET:  We do recommend a small meal at first, but then you may proceed to your  regular diet.  Drink plenty of fluids but you should avoid alcoholic beverages for 24 hours.  ACTIVITY:  You should plan to take it easy for the rest of today and you should NOT DRIVE or use heavy machinery until tomorrow (because of the sedation medicines used during the test).    FOLLOW UP: Our staff will call the number listed on your records the next business day following your procedure.  We will call around 7:15- 8:00 am to check on you and address any questions or concerns that you may have regarding the information given to you following your procedure. If we do not reach you, we will leave a message.     If any biopsies were taken you will be contacted by phone or by letter within the next 1-3 weeks.  Please call us  at [phone] if you have not heard about the biopsies in 3 weeks.    SIGNATURES/CONFIDENTIALITY: You and/or your care partner have signed paperwork which will be entered into your electronic medical record.  These signatures attest to the fact that that the information above on your After Visit Summary has been reviewed and is understood.  Full responsibility of the confidentiality of this discharge information lies with you and/or your care-partner.

## 2024-03-22 NOTE — Op Note (Signed)
 [facility] Patient Name: Courtney [name] Procedure Date: 03/22/2024 9:09 AM MRN: [id number] Endoscopist: Lupita FORBES Commander , MD, [id number] Age: 42 Referring MD:  Date of Birth: [birth year] Gender: Female Account #: [id number] Procedure:                Colonoscopy Indications:              Screening for colorectal malignant neoplasm, This                            is the patient's first colonoscopy Medicines:                Monitored Anesthesia Care Procedure:                Pre-Anesthesia Assessment:                           - Prior to the procedure, a History and Physical                            was performed, and patient medications and                            allergies were reviewed. The patient's tolerance of                            previous anesthesia was also reviewed. The risks                            and benefits of the procedure and the sedation                            options and risks were discussed with the patient.                            All questions were answered, and informed consent                            was obtained. Prior Anticoagulants: The patient has                            taken no anticoagulant or antiplatelet agents. ASA                            Grade Assessment: II - A patient with mild systemic                            disease. After reviewing the risks and benefits,                            the patient was deemed in satisfactory condition to                            undergo the procedure.  After obtaining informed consent, the colonoscope                            was passed under direct vision. Throughout the                            procedure, the patient's blood pressure, pulse, and                            oxygen saturations were monitored continuously. The                            [id number]-HQ190L Colonoscope [id number] was introduced                            through the anus and  advanced to the the cecum,                            identified by appendiceal orifice and ileocecal                            valve. The colonoscopy was somewhat difficult due                            to significant looping. Successful completion of                            the procedure was aided by applying abdominal                            pressure. The patient tolerated the procedure well.                            The quality of the bowel preparation was good. The                            ileocecal valve, appendiceal orifice, and rectum                            were photographed. The bowel preparation used was                            SUPREP via split dose instruction. Scope In: 9:18:56 AM Scope Out: 9:33:06 AM Scope Withdrawal Time: 0 hours 10 minutes 15 seconds  Total Procedure Duration: 0 hours 14 minutes 10 seconds  Findings:                 The perianal and digital rectal examinations were                            normal.                           The entire examined colon appeared normal on direct  and retroflexion views. Complications:            No immediate complications. Estimated Blood Loss:     Estimated blood loss: none. Impression:               - The entire examined colon is normal on direct and                            retroflexion views.                           - No specimens collected. Recommendation:           - Patient has a contact number available for                            emergencies. The signs and symptoms of potential                            delayed complications were discussed with the                            patient. Return to normal activities tomorrow.                            Written discharge instructions were provided to the                            patient.                           - Resume previous diet.                           - Continue present medications.                            - Repeat colonoscopy in 10 years for screening                            purposes. Lupita FORBES Commander, MD 03/22/2024 9:41:49 AM This report has been signed electronically.

## 2024-03-23 ENCOUNTER — Telehealth: Payer: Self-pay | Admitting: *Deleted

## 2024-03-23 NOTE — Telephone Encounter (Signed)
  Follow up Call-     03/22/2024    8:03 AM  Call back number  Post procedure Call Back phone  # [phone]  Permission to leave phone message Yes     Patient questions:  Do you have a fever, pain , or abdominal swelling? No. Pain Score  0 *  Have you tolerated food without any problems? Yes.    Have you been able to return to your normal activities? Yes.    Do you have any questions about your discharge instructions: Diet   No. Medications  No. Follow up visit  No.  Do you have questions or concerns about your Care? No.  Actions: * If pain score is 4 or above: No action needed, pain <4.

## 2024-03-24 ENCOUNTER — Encounter: Admitting: Obstetrics and Gynecology

## 2024-04-30 ENCOUNTER — Other Ambulatory Visit: Payer: Self-pay | Admitting: Family Medicine

## 2024-06-07 ENCOUNTER — Encounter: Payer: Self-pay | Admitting: Family Medicine

## 2024-06-07 ENCOUNTER — Ambulatory Visit: Admitting: Family Medicine

## 2024-06-07 VITALS — BP 133/92 | HR 91 | Ht 66.0 in | Wt 192.6 lb

## 2024-06-07 DIAGNOSIS — I10 Essential (primary) hypertension: Secondary | ICD-10-CM | POA: Diagnosis not present

## 2024-06-07 MED ORDER — LOSARTAN POTASSIUM 100 MG PO TABS: 100.0000 mg | ORAL_TABLET | Freq: Every day | ORAL | 0 refills | Status: DC

## 2024-06-07 NOTE — Progress Notes (Signed)
 Established Patient Office Visit  Subjective    Patient ID: Courtney [name], female    DOB: [birth year]  Age: 43 y.o. MRN: [id number]  CC:  Chief Complaint  Patient presents with   Medical Management of Chronic Issues    HPI Courtney [name] presents for routine follow up of hypertension. Patient reports med compliance and denies acute complaints or concerns.   Outpatient Encounter Medications as of 06/07/2024  Medication Sig   FERROUS SULFATE ER PO Take by mouth.   losartan  (COZAAR ) 100 MG tablet Take 1 tablet (100 mg total) by mouth daily.   Multiple Vitamin (MULTIVITAMIN) capsule Take 1 capsule by mouth daily.   [DISCONTINUED] losartan  (COZAAR ) 50 MG tablet TAKE 1 TABLET BY MOUTH EVERY DAY   adapalene (DIFFERIN) 0.1 % cream    lansoprazole  (PREVACID ) 30 MG capsule Take 1 capsule (30 mg total) by mouth daily. (Patient not taking: Reported on 06/07/2024)   No facility-administered encounter medications on file as of 06/07/2024.    Past Medical History:  Diagnosis Date   HTN (hypertension)     Past Surgical History:  Procedure Laterality Date   CESAREAN SECTION     KNEE SURGERY     TUBAL LIGATION      Family History  Problem Relation Age of Onset   Cancer Other        Breast Cancer   Early death Neg Hx    Heart disease Neg Hx    Hypertension Neg Hx    Kidney disease Neg Hx    Stroke Neg Hx    Colon cancer Neg Hx    Rectal cancer Neg Hx    Stomach cancer Neg Hx     Social History   Socioeconomic History   Marital status: Married    Spouse name: Not on file   Number of children: Not on file   Years of education: Not on file   Highest education level: GED or equivalent  Occupational History   Not on file  Tobacco Use   Smoking status: Never    Passive exposure: Never   Smokeless tobacco: Never  Vaping Use   Vaping status: Never Used  Substance and Sexual Activity   Alcohol use: Yes    Alcohol/week: 6.0 standard drinks of alcohol    Types: 6 Cans of  beer per week   Drug use: No   Sexual activity: Yes    Birth control/protection: Surgical  Other Topics Concern   Not on file  Social History Narrative   Not on file   Social Drivers of Health   Financial Resource Strain: Low Risk  (01/15/2024)   Overall Financial Resource Strain (CARDIA)    Difficulty of Paying Living Expenses: Not hard at all  Food Insecurity: No Food Insecurity (01/15/2024)   Hunger Vital Sign    Worried About Running Out of Food in the Last Year: Never true    Ran Out of Food in the Last Year: Never true  Transportation Needs: No Transportation Needs (01/15/2024)   PRAPARE - Administrator, Civil Service (Medical): No    Lack of Transportation (Non-Medical): No  Physical Activity: Sufficiently Active (01/15/2024)   Exercise Vital Sign    Days of Exercise per Week: 5 days    Minutes of Exercise per Session: 150+ min  Stress: No Stress Concern Present (01/15/2024)   [employer] of Occupational Health - Occupational Stress Questionnaire    Feeling of Stress : Not at all  Social  Connections: Moderately Isolated (01/15/2024)   Social Connection and Isolation Panel    Frequency of Communication with Friends and Family: More than three times a week    Frequency of Social Gatherings with Friends and Family: Once a week    Attends Religious Services: Never    Database administrator or Organizations: No    Attends Engineer, structural: Not on file    Marital Status: Married  Catering manager Violence: Not on file    Review of Systems  All other systems reviewed and are negative.       Objective    BP (!) 133/92   Pulse 91   Ht 5' 6 (1.676 m)   Wt 192 lb 9.6 oz (87.4 kg)   LMP 02/20/2022   SpO2 98%   BMI 31.09 kg/m   Physical Exam Vitals and nursing note reviewed.  Constitutional:      General: She is not in acute distress. Cardiovascular:     Rate and Rhythm: Normal rate and regular rhythm.  Pulmonary:     Effort: Pulmonary  effort is normal.     Breath sounds: Normal breath sounds.  Abdominal:     Palpations: Abdomen is soft.     Tenderness: There is no abdominal tenderness.  Neurological:     General: No focal deficit present.     Mental Status: She is alert and oriented to person, place, and time.         Assessment & Plan:   Essential hypertension  Other orders -     Losartan  Potassium; Take 1 tablet (100 mg total) by mouth daily.  Dispense: 90 tablet; Refill: 0     Return in about 3 months (around 09/06/2024) for follow up.   Tanda Raguel SQUIBB, MD

## 2024-09-09 ENCOUNTER — Other Ambulatory Visit: Payer: Self-pay | Admitting: Family Medicine

## 2024-09-10 NOTE — Telephone Encounter (Signed)
 Complete

## 2024-09-14 ENCOUNTER — Encounter: Payer: Self-pay | Admitting: Family Medicine

## 2024-09-14 ENCOUNTER — Ambulatory Visit: Admitting: Family Medicine

## 2024-09-14 VITALS — BP 126/88 | HR 71 | Ht 66.0 in | Wt 191.2 lb

## 2024-09-14 DIAGNOSIS — I10 Essential (primary) hypertension: Secondary | ICD-10-CM | POA: Diagnosis not present

## 2024-09-14 MED ORDER — AMLODIPINE BESYLATE 5 MG PO TABS: 5.0000 mg | ORAL_TABLET | Freq: Every day | ORAL | 0 refills | Status: AC

## 2024-09-14 NOTE — Progress Notes (Signed)
 "  Established Patient Office Visit  Subjective    Patient ID: Courtney [name], female    DOB: 1981/04/10  Age: 44 y.o. MRN: [id number]  CC:  Chief Complaint  Patient presents with   Medical Management of Chronic Issues    HPI Courtney [name] presents for follow up of hypertension. Patient reports med compliance and denies acute complaints.   Outpatient Encounter Medications as of 09/14/2024  Medication Sig   amLODipine  (NORVASC ) 5 MG tablet Take 1 tablet (5 mg total) by mouth daily.   FERROUS SULFATE ER PO Take by mouth.   losartan  (COZAAR ) 100 MG tablet TAKE 1 TABLET BY MOUTH EVERY DAY   Multiple Vitamin (MULTIVITAMIN) capsule Take 1 capsule by mouth daily.   adapalene (DIFFERIN) 0.1 % cream    lansoprazole  (PREVACID ) 30 MG capsule Take 1 capsule (30 mg total) by mouth daily. (Patient not taking: Reported on 06/07/2024)   No facility-administered encounter medications on file as of 09/14/2024.    Past Medical History:  Diagnosis Date   HTN (hypertension)     Past Surgical History:  Procedure Laterality Date   CESAREAN SECTION     KNEE SURGERY     TUBAL LIGATION      Family History  Problem Relation Age of Onset   Cancer Other        Breast Cancer   Early death Neg Hx    Heart disease Neg Hx    Hypertension Neg Hx    Kidney disease Neg Hx    Stroke Neg Hx    Colon cancer Neg Hx    Rectal cancer Neg Hx    Stomach cancer Neg Hx     Social History   Socioeconomic History   Marital status: Married    Spouse name: Not on file   Number of children: Not on file   Years of education: Not on file   Highest education level: GED or equivalent  Occupational History   Not on file  Tobacco Use   Smoking status: Never    Passive exposure: Never   Smokeless tobacco: Never  Vaping Use   Vaping status: Never Used  Substance and Sexual Activity   Alcohol use: Yes    Alcohol/week: 6.0 standard drinks of alcohol    Types: 6 Cans of beer per week   Drug use: No    Sexual activity: Yes    Birth control/protection: Surgical  Other Topics Concern   Not on file  Social History Narrative   Not on file   Social Drivers of Health   Tobacco Use: Low Risk (09/14/2024)   Patient History    Smoking Tobacco Use: Never    Smokeless Tobacco Use: Never    Passive Exposure: Never  Financial Resource Strain: Low Risk (01/15/2024)   Overall Financial Resource Strain (CARDIA)    Difficulty of Paying Living Expenses: Not hard at all  Food Insecurity: No Food Insecurity (01/15/2024)   Hunger Vital Sign    Worried About Running Out of Food in the Last Year: Never true    Ran Out of Food in the Last Year: Never true  Transportation Needs: No Transportation Needs (01/15/2024)   PRAPARE - Administrator, Civil Service (Medical): No    Lack of Transportation (Non-Medical): No  Physical Activity: Sufficiently Active (01/15/2024)   Exercise Vital Sign    Days of Exercise per Week: 5 days    Minutes of Exercise per Session: 150+ min  Stress: No Stress  Concern Present (01/15/2024)   [employer] of Occupational Health - Occupational Stress Questionnaire    Feeling of Stress : Not at all  Social Connections: Moderately Isolated (01/15/2024)   Social Connection and Isolation Panel    Frequency of Communication with Friends and Family: More than three times a week    Frequency of Social Gatherings with Friends and Family: Once a week    Attends Religious Services: Never    Database Administrator or Organizations: No    Attends Engineer, Structural: Not on file    Marital Status: Married  Catering Manager Violence: Not At Risk (09/14/2024)   Epic    Fear of Current or Ex-Partner: No    Emotionally Abused: No    Physically Abused: No    Sexually Abused: No  Depression (PHQ2-9): Low Risk (04/25/2022)   Depression (PHQ2-9)    PHQ-2 Score: 0  Alcohol Screen: Low Risk (01/15/2024)   Alcohol Screen    Last Alcohol Screening Score (AUDIT): 2  Housing: Low  Risk (01/15/2024)   Housing Stability Vital Sign    Unable to Pay for Housing in the Last Year: No    Number of Times Moved in the Last Year: 0    Homeless in the Last Year: No  Utilities: Not At Risk (09/14/2024)   Epic    Threatened with loss of utilities: No  Health Literacy: Not on file    Review of Systems  All other systems reviewed and are negative.       Objective    BP 126/88   Pulse 71   Ht 5' 6 (1.676 m)   Wt 191 lb 3.2 oz (86.7 kg)   LMP 02/20/2022   SpO2 98%   BMI 30.86 kg/m   Physical Exam Vitals and nursing note reviewed.  Constitutional:      General: She is not in acute distress. Cardiovascular:     Rate and Rhythm: Normal rate and regular rhythm.  Pulmonary:     Effort: Pulmonary effort is normal.     Breath sounds: Normal breath sounds.  Abdominal:     Palpations: Abdomen is soft.     Tenderness: There is no abdominal tenderness.  Neurological:     General: No focal deficit present.     Mental Status: She is alert and oriented to person, place, and time.         Assessment & Plan:   Essential hypertension  Other orders -     amLODIPine  Besylate; Take 1 tablet (5 mg total) by mouth daily.  Dispense: 90 tablet; Refill: 0   Will add amlodipine  5 mg to regimen.   Return in about 3 months (around 12/13/2024) for follow up, chronic med issues.   Tanda Raguel SQUIBB, MD  "

## 2024-10-07 ENCOUNTER — Encounter: Payer: Self-pay | Admitting: Family Medicine

## 2024-12-13 ENCOUNTER — Ambulatory Visit: Payer: Self-pay | Admitting: Family Medicine

## 2024-12-16 ENCOUNTER — Ambulatory Visit: Payer: Self-pay | Admitting: Family Medicine
